# Patient Record
Sex: Female | Born: 1935 | Race: Black or African American | Hispanic: No | Smoking: Current some day smoker
Health system: Southern US, Community
[De-identification: ages and names within clinical notes are randomized; demographics above are authoritative.]

## PROBLEM LIST (undated history)

## (undated) DIAGNOSIS — J40 Bronchitis, not specified as acute or chronic: Secondary | ICD-10-CM

## (undated) DIAGNOSIS — J449 Chronic obstructive pulmonary disease, unspecified: Secondary | ICD-10-CM

## (undated) DIAGNOSIS — I10 Essential (primary) hypertension: Secondary | ICD-10-CM

## (undated) HISTORY — DX: Essential (primary) hypertension: I10

## (undated) HISTORY — PX: VESICOVAGINAL FISTULA CLOSURE W/ TAH: SUR271

---

## 2013-04-05 ENCOUNTER — Other Ambulatory Visit: Payer: Self-pay | Admitting: Internal Medicine

## 2013-04-05 DIAGNOSIS — J449 Chronic obstructive pulmonary disease, unspecified: Secondary | ICD-10-CM

## 2013-04-05 DIAGNOSIS — F17200 Nicotine dependence, unspecified, uncomplicated: Secondary | ICD-10-CM

## 2013-04-12 ENCOUNTER — Ambulatory Visit (HOSPITAL_COMMUNITY)
Admission: RE | Admit: 2013-04-12 | Discharge: 2013-04-12 | Disposition: A | Discharge status: Home / Self Care | Payer: Medicare Other | Source: Ambulatory Visit | Attending: Internal Medicine | Admitting: Internal Medicine

## 2013-04-12 ENCOUNTER — Other Ambulatory Visit: Payer: Self-pay | Admitting: Internal Medicine

## 2013-04-12 DIAGNOSIS — J449 Chronic obstructive pulmonary disease, unspecified: Secondary | ICD-10-CM

## 2013-04-12 DIAGNOSIS — F17200 Nicotine dependence, unspecified, uncomplicated: Secondary | ICD-10-CM

## 2013-05-05 ENCOUNTER — Encounter: Payer: Self-pay | Admitting: Internal Medicine

## 2013-05-05 ENCOUNTER — Ambulatory Visit (INDEPENDENT_AMBULATORY_CARE_PROVIDER_SITE_OTHER): Payer: Medicare Other | Admitting: Internal Medicine

## 2013-05-05 ENCOUNTER — Ambulatory Visit (INDEPENDENT_AMBULATORY_CARE_PROVIDER_SITE_OTHER)
Admission: RE | Admit: 2013-05-05 | Discharge: 2013-05-05 | Disposition: A | Discharge status: Home / Self Care | Payer: Medicare Other | Source: Ambulatory Visit | Attending: Internal Medicine | Admitting: Internal Medicine

## 2013-05-05 VITALS — BP 120/86 | HR 95 | Temp 100.3°F | Ht 66.0 in | Wt 135.0 lb

## 2013-05-05 DIAGNOSIS — J449 Chronic obstructive pulmonary disease, unspecified: Secondary | ICD-10-CM

## 2013-05-05 DIAGNOSIS — F17200 Nicotine dependence, unspecified, uncomplicated: Secondary | ICD-10-CM

## 2013-05-05 DIAGNOSIS — J441 Chronic obstructive pulmonary disease with (acute) exacerbation: Secondary | ICD-10-CM

## 2013-05-05 MED ORDER — BUDESONIDE-FORMOTEROL FUMARATE 160-4.5 MCG/ACT IN AERO: INHALATION_SPRAY | RESPIRATORY_TRACT | Status: DC

## 2013-05-05 NOTE — Patient Instructions (Addendum)
symbicort 160 Take 2 puffs first thing in am and then another 2 puffs about 12 hours later.   Supplement as needed for breathing with proaire 2 puffs every 4 hours  Work on inhaler technique:  relax and gently blow all the way out then take a nice smooth deep breath back in, triggering the inhaler at same time you start breathing in.  Hold for up to 5 seconds if you can.  Rinse and gargle with water when done     Please remember to go to the  x-ray department downstairs for your tests - we will call you with the results when they are available.  The key is to stop smoking completely before smoking completely stops you!   Please schedule a follow up office visit in 6 weeks, call sooner if needed with pfts

## 2013-05-05 NOTE — Progress Notes (Signed)
  Subjective:    Patient ID: Rita Shepherd, female    DOB: 09-Aug-1935 MRN: [id number]  HPI  21 yobf active smoker with dx of copd by [facility] Sept 2013 maintained on combivent and proaire and referred by Dr Margaretmary Bayley 05/05/13 to pulmonary clinic.   05/05/2013 1st pulmonary eval/ Carey Lafon  With indolent progressive doe since Sept 2013 assoc with mostly rattling but mostly non prod cough/ hoarseness while on prednisone at 20 mg qod as a floor. Presently ok walking flat and slow but sob with inclines and walking fast more than 100 ft or so. No problem at rest.  Some Better p combivent or proaire  No obvious daytime variabilty or assoc chronic cough or cp or chest tightness, subjective wheeze overt sinus or hb symptoms. No unusual exp hx or h/o childhood pna/ asthma or knowledge of premature birth.   Sleeping ok without nocturnal  or early am exacerbation  of respiratory  c/o's or need for noct saba. Also denies any obvious fluctuation of symptoms with weather or environmental changes or other aggravating or alleviating factors except as outlined above   Review of Systems  Constitutional: Negative for fever, chills and unexpected weight change.  HENT: Positive for congestion and dental problem. Negative for ear pain, nosebleeds, sore throat, rhinorrhea, sneezing, trouble swallowing, voice change, postnasal drip and sinus pressure.   Eyes: Negative for visual disturbance.  Respiratory: Positive for cough and shortness of breath. Negative for choking.   Cardiovascular: Negative for chest pain and leg swelling.  Gastrointestinal: Negative for vomiting, abdominal pain and diarrhea.  Genitourinary: Negative for difficulty urinating.  Musculoskeletal: Negative for arthralgias.  Skin: Negative for rash.  Neurological: Positive for headaches. Negative for tremors and syncope.  Hematological: Does not bruise/bleed easily.       Objective:   Physical Exam  Thin amb bf with congested  cough Wt Readings from Last 3 Encounters:  05/05/13 135 lb (61.236 kg)    HEENT mild turbinate edema.  Oropharynx no thrush or excess pnd or cobblestoning.  No JVD or cervical adenopathy. Mild accessory muscle hypertrophy. Trachea midline, nl thryroid. Chest was hyperinflated by percussion with diminished breath sounds and moderate increased exp time without wheeze. Hoover sign positive at mid inspiration. Regular rate and rhythm without murmur gallop or rub or increase P2 or edema.  Abd: no hsm, nl excursion. Ext warm without cyanosis or clubbing.    CXR  05/05/2013 :    No acute cardiopulmonary disease     Assessment & Plan:

## 2013-05-08 DIAGNOSIS — J449 Chronic obstructive pulmonary disease, unspecified: Secondary | ICD-10-CM | POA: Insufficient documentation

## 2013-05-08 DIAGNOSIS — F17210 Nicotine dependence, cigarettes, uncomplicated: Secondary | ICD-10-CM | POA: Insufficient documentation

## 2013-05-08 NOTE — Assessment & Plan Note (Signed)

## 2013-05-08 NOTE — Assessment & Plan Note (Signed)
DDX of  difficult airways managment all start with A and  include Adherence, Ace Inhibitors, Acid Reflux, Active Sinus Disease, Alpha 1 Antitripsin deficiency, Anxiety masquerading as Airways dz,  ABPA,  allergy(esp in young), Aspiration (esp in elderly), Adverse effects of DPI,  Active smokers, plus two Bs  = Bronchiectasis and Beta blocker use..and one C= CHF  Adherence is always the initial "prime suspect" and is a multilayered concern that requires a "trust but verify" approach in every patient - starting with knowing how to use medications, especially inhalers, correctly, keeping up with refills and understanding the fundamental difference between maintenance and prns vs those medications only taken for a very short course and then stopped and not refilled. The proper method of use, as well as anticipated side effects, of a metered-dose inhaler are discussed and demonstrated to the patient. Improved effectiveness after extensive coaching during this visit to a level of approximately  75% so start symbicort 160 2bid  Active smoking > discussed separately  See instructions for specific recommendations which were reviewed directly with the patient who was given a copy with highlighter outlining the key components.

## 2013-05-09 NOTE — Progress Notes (Signed)
Quick Note:  Spoke with pt and notified of results per Dr. Wert. Pt verbalized understanding and denied any questions.  ______ 

## 2013-06-16 ENCOUNTER — Encounter: Payer: Self-pay | Admitting: Internal Medicine

## 2013-06-16 ENCOUNTER — Ambulatory Visit (INDEPENDENT_AMBULATORY_CARE_PROVIDER_SITE_OTHER): Payer: Medicare Other | Admitting: Internal Medicine

## 2013-06-16 VITALS — BP 160/100 | HR 44 | Temp 98.4°F | Ht 66.0 in | Wt 137.0 lb

## 2013-06-16 DIAGNOSIS — I10 Essential (primary) hypertension: Secondary | ICD-10-CM

## 2013-06-16 DIAGNOSIS — J449 Chronic obstructive pulmonary disease, unspecified: Secondary | ICD-10-CM

## 2013-06-16 DIAGNOSIS — F17200 Nicotine dependence, unspecified, uncomplicated: Secondary | ICD-10-CM

## 2013-06-16 LAB — PULMONARY FUNCTION TEST
DL/VA % pred: 57 %
FEF 25-75 Post: 1.12 L/sec
FEF2575-%Pred-Post: 69 %
FEV1-%Change-Post: 24 %
FEV1-%Pred-Pre: 67 %
FEV1-Post: 1.57 L
FEV1-Pre: 1.26 L
FEV1FVC-%Change-Post: -1 %
FEV1FVC-%Pred-Pre: 76 %
FVC-%Pred-Pre: 90 %
FVC-Post: 2.76 L
Post FEV1/FVC ratio: 57 %
Pre FEV1/FVC ratio: 58 %
Pre FEV6/FVC Ratio: 95 %
RV % pred: 139 %
RV: 3.34 L

## 2013-06-16 NOTE — Progress Notes (Signed)
  Subjective:    Patient ID: Rita Shepherd, female    DOB: 10/20/1935 MRN: [id number]   Brief patient profile:  29 yobf active smoker with dx of copd by [facility] Sept 2013 maintained on combivent and proaire and referred by Dr Margaretmary Bayley 05/05/13 to pulmonary clinic.   05/05/2013 1st pulmonary eval/ Rachael Ferrie  With indolent progressive doe since Sept 2013 assoc with mostly rattling but mostly non prod cough/ hoarseness while on prednisone at 20 mg qod as a floor. Presently ok walking flat and slow but sob with inclines and walking fast more than 100 ft or so. No problem at rest.  Some Better p combivent or proaire rec symbicort 160 Take 2 puffs first thing in am and then another 2 puffs about 12 hours later.  Supplement as needed for breathing with proaire 2 puffs every 4 hours Work on inhaler technique:    The key is to stop smoking completely before smoking completely stops you!   06/16/2013 f/u ov/Joeseph Verville re GOLD I copd Chief Complaint  Patient presents with  . Followup with PFT    Pt reports breathing is much improved since her last visit. No new co's today.   rare need for saba / min smoking   No obvious daytime variabilty or assoc purulent sputum production cp or chest tightness, subjective wheeze overt sinus or hb symptoms. No unusual exp hx or h/o childhood pna/ asthma or knowledge of premature birth.   Sleeping ok without nocturnal  or early am exacerbation  of respiratory  c/o's or need for noct saba. Also denies any obvious fluctuation of symptoms with weather or environmental changes or other aggravating or alleviating factors except as outlined above.  Current Medications, Allergies, Past Medical History, Past Surgical History, Family History, and Social History were reviewed in [employer] record.  ROS  The following are not active complaints unless bolded sore throat, dysphagia, dental problems, itching, sneezing,  nasal congestion or excess/  purulent secretions, ear ache,   fever, chills, sweats, unintended wt loss, pleuritic or exertional cp, hemoptysis,  orthopnea pnd or leg swelling, presyncope, palpitations, heartburn, abdominal pain, anorexia, nausea, vomiting, diarrhea  or change in bowel or urinary habits, change in stools or urine, dysuria,hematuria,  rash, arthralgias, visual complaints, headache, numbness weakness or ataxia or problems with walking or coordination,  change in mood/affect or memory.            Objective:   Physical Exam  Thin amb bf with mildly  Congested sounding cough  Wt Readings from Last 3 Encounters:  06/16/13 137 lb (62.143 kg)  05/05/13 135 lb (61.236 kg)      HEENT mild turbinate edema.  Oropharynx no thrush or excess pnd or cobblestoning.  No JVD or cervical adenopathy. Mild accessory muscle hypertrophy. Trachea midline, nl thryroid. Chest was hyperinflated by percussion with diminished breath sounds and moderate increased exp time without wheeze. Hoover sign positive at mid inspiration. Regular rate and rhythm without murmur gallop or rub or increase P2 or edema.  Abd: no hsm, nl excursion. Ext warm without cyanosis or clubbing.    CXR  05/05/2013 :    No acute cardiopulmonary disease     Assessment & Plan:

## 2013-06-16 NOTE — Progress Notes (Signed)
PFT done today. 

## 2013-06-16 NOTE — Patient Instructions (Addendum)
Continue symbicort 160 Take 2 puffs first thing in am and then another 2 puffs about 12 hours later.   Only use your albuterol as a rescue medication to be used if you can't catch your breath by resting or doing a relaxed purse lip breathing pattern. The less you use it, the better it will work when you need it.    If you are satisfied with your treatment plan let your doctor know and he/she can either refill your medications or you can return here when your prescription runs out.     If in any way you are not 100% satisfied,  please tell us.  If 100% better, tell your friends!

## 2013-06-17 DIAGNOSIS — I10 Essential (primary) hypertension: Secondary | ICD-10-CM | POA: Insufficient documentation

## 2013-06-17 NOTE — Assessment & Plan Note (Signed)
>  3 min I reviewed the Flethcher curve with patient that basically indicates  if you quit smoking when your best day FEV1 is still well preserved (as hers clearly is)  it is highly unlikely you will progress to severe disease and informed the patient there was no medication on the market that has proven to change the curve or the likelihood of progression.  Therefore stopping smoking and maintaining abstinence is the most important aspect of care, not choice of inhalers or for that matter, doctors.

## 2013-06-17 NOTE — Assessment & Plan Note (Addendum)
Transiently elevated after pft "exertion" > no change rx rec but did rec f/u with her primary doctor

## 2013-06-17 NOTE — Assessment & Plan Note (Signed)
-   PFT's 06/16/13  FEV1 1.26 (67%) ratio 68 but FEV1  84% p B2,  raio 58 and DLCO 45 corrrects 57%   She barely has copd at all and perhaps is better characterized as CB with AB component doing much better despite still smoking some and suboptimal hfa  The proper method of use, as well as anticipated side effects, of a metered-dose inhaler are discussed and demonstrated to the patient. Improved effectiveness after extensive coaching during this visit to a level of approximately  75%  rec continue symbicort and prn saba, work on complete smoking cessation , and f/u fprn  See instructions for specific recommendations which were reviewed directly with the patient who was given a copy with highlighter outlining the key components.

## 2013-09-11 ENCOUNTER — Encounter: Payer: Self-pay | Admitting: Critical Care Medicine

## 2013-09-11 ENCOUNTER — Ambulatory Visit (INDEPENDENT_AMBULATORY_CARE_PROVIDER_SITE_OTHER): Payer: Medicare Other | Admitting: Critical Care Medicine

## 2013-09-11 VITALS — BP 116/88 | HR 80 | Temp 98.3°F | Ht 66.0 in | Wt 131.0 lb

## 2013-09-11 DIAGNOSIS — J449 Chronic obstructive pulmonary disease, unspecified: Secondary | ICD-10-CM

## 2013-09-11 MED ORDER — PREDNISONE 10 MG PO TABS: ORAL_TABLET | ORAL | Status: DC

## 2013-09-11 MED ORDER — AZITHROMYCIN 250 MG PO TABS: 250.0000 mg | ORAL_TABLET | Freq: Every day | ORAL | Status: DC

## 2013-09-11 NOTE — Patient Instructions (Signed)
Azithromycin 250mg  Take two once then one daily until gone Prednisone 10mg  Take 4 for three days 3 for three days 2 for three days 1 for three days and stop Stay on symbicort Consider Nicorette minis 4mg  one 3-4 times daily, dissolve in the mouth Return to see Dr wert for follow up in 6 weeks

## 2013-09-11 NOTE — Progress Notes (Signed)
Subjective:    Patient ID: Rita Shepherd, female    DOB: 11-Aug-1935, 78 y.o.   MRN: [id number]  HPI  Subjective:    Patient ID: Rita Shepherd, female    DOB: Feb 26, 1935 MRN: [id number]   Brief patient profile:  65 yobf active smoker with dx of copd by [facility] Sept 2013 maintained on combivent and proaire and referred by Dr Margaretmary Bayley 05/05/13 to pulmonary clinic.   05/05/2013 1st pulmonary eval/ Wert  With indolent progressive doe since Sept 2013 assoc with mostly rattling but mostly non prod cough/ hoarseness while on prednisone at 20 mg qod as a floor. Presently ok walking flat and slow but sob with inclines and walking fast more than 100 ft or so. No problem at rest.  Some Better p combivent or proaire rec symbicort 160 Take 2 puffs first thing in am and then another 2 puffs about 12 hours later.  Supplement as needed for breathing with proaire 2 puffs every 4 hours Work on inhaler technique:    The key is to stop smoking completely before smoking completely stops you!   06/2013 f/u ov/Wert re GOLD I cop rare need for saba / min smoking   No obvious daytime variabilty or assoc purulent sputum production cp or chest tightness, subjective wheeze overt sinus or hb symptoms. No unusual exp hx or h/o childhood pna/ asthma or knowledge of premature birth.   Sleeping ok without nocturnal  or early am exacerbation  of respiratory  c/o's or need for noct saba. Also denies any obvious fluctuation of symptoms with weather or environmental changes or other aggravating or alleviating factors except as outlined above.  09/11/2013 Chief Complaint  Patient presents with  . Acute Visit    [facility] pt.  C/o increased SOB x 2-3 days, wheezing, chest tightness, and prod cough with yellow mucus.    Pt started on symbicort and did well for two months but past week is worse. Not lasting, coughing more yellow, notes some wheezing.  No real chest pain, feels congested.  More dyspnea is noted,  some qhs cough.   Worse if walk to bathroom. Proair is used more  Pt still smoking some.  On nicorette gum  Current Medications, Allergies, Past Medical History, Past Surgical History, Family History, and Social History were reviewed in [employer] record.  ROS  The following are not active complaints unless bolded sore throat, dysphagia, dental problems, itching, sneezing,  nasal congestion or excess/ purulent secretions, ear ache,   fever, chills, sweats, unintended wt loss, pleuritic or exertional cp, hemoptysis,  orthopnea pnd or leg swelling, presyncope, palpitations, heartburn, abdominal pain, anorexia, nausea, vomiting, diarrhea  or change in bowel or urinary habits, change in stools or urine, dysuria,hematuria,  rash, arthralgias, visual complaints, headache, numbness weakness or ataxia or problems with walking or coordination,  change in mood/affect or memory.            Objective:   Physical Exam  Thin amb bf with mildly  Congested sounding cough  Wt Readings from Last 3 Encounters:  09/11/13 131 lb (59.421 kg)  06/16/13 137 lb (62.143 kg)  05/05/13 135 lb (61.236 kg)      Filed Vitals:   09/11/13 1629  BP: 116/88  Pulse: 80  Temp: 98.3 F (36.8 C)  TempSrc: Oral  Height: 5\' 6"  (1.676 m)  Weight: 131 lb (59.421 kg)  SpO2: 95%    Gen: Pleasant, well-nourished, in no distress,  normal affect  ENT: No  lesions,  mouth clear,  oropharynx clear, no postnasal drip  Neck: No JVD, no TMG, no carotid bruits  Lungs: No use of accessory muscles, no dullness to percussion, expired wheezes scattered rhonchi  Cardiovascular: RRR, heart sounds normal, no murmur or gallops, no peripheral edema  Abdomen: soft and NT, no HSM,  BS normal  Musculoskeletal: No deformities, no cyanosis or clubbing  Neuro: alert, non focal  Skin: Warm, no lesions or rashes  No results found.  CXR  05/05/2013 :    No acute cardiopulmonary disease     Assessment &  Plan:   COPD GOLD 1  Gold stage A COPD with acute exacerbation The patient continues to actively smoke Plan Azithromycin 250mg  Take two once then one daily until gone Prednisone 10mg  Take 4 for three days 3 for three days 2 for three days 1 for three days and stop Stay on symbicort Consider Nicorette minis 4mg  one 3-4 times daily, dissolve in the mouth Return to see Dr wert for follow up in 6 weeks    Updated Medication List Outpatient Encounter Prescriptions as of 09/11/2013  Medication Sig Dispense Refill  . albuterol (PROAIR HFA) 108 (90 BASE) MCG/ACT inhaler Inhale 2 puffs into the lungs every 6 (six) hours as needed for wheezing or shortness of breath.      Marland Kitchen amLODipine (NORVASC) 5 MG tablet Take 5 mg by mouth daily.      Marland Kitchen aspirin 81 MG tablet Take 81 mg by mouth daily.      . bisacodyl (DULCOLAX) 10 MG suppository Place 10 mg rectally as needed for constipation.      . budesonide-formoterol (SYMBICORT) 160-4.5 MCG/ACT inhaler Take 2 puffs first thing in am and then another 2 puffs about 12 hours later.  1 Inhaler  11  . Cholecalciferol (VITAMIN D) 2000 UNITS CAPS Take 1 capsule by mouth daily.      Marland Kitchen dextromethorphan-guaiFENesin (MUCINEX DM) 30-600 MG per 12 hr tablet Take 1 tablet by mouth every 12 (twelve) hours as needed.      . docusate sodium (COLACE) 100 MG capsule Take 100 mg by mouth at bedtime.      . Homeopathic Products ([employer] LEG CRAMPS PAIN RELIEF PO) Take 1 tablet by mouth daily as needed.      . Hypertonic Nasal Wash (SINUS RINSE NA) Place into the nose 2 (two) times daily as needed.      Marland Kitchen losartan-hydrochlorothiazide (HYZAAR) 100-12.5 MG per tablet Take 1 tablet by mouth daily.      . nicotine polacrilex (NICORETTE) 2 MG gum Take 2 mg by mouth as needed for smoking cessation.      . Potassium Gluconate 595 MG CAPS Take 1 capsule by mouth daily.      . Probiotic Product (HEALTHY COLON) CAPS Take 1 capsule by mouth daily.      . vitamin E 400 UNIT capsule Take 400  Units by mouth daily.      Marland Kitchen azithromycin (ZITHROMAX) 250 MG tablet Take 1 tablet (250 mg total) by mouth daily. Take two once then one daily until gone  6 each  0  . predniSONE (DELTASONE) 10 MG tablet Take 4 for three days 3 for three days 2 for three days 1 for three days and stop  30 tablet  0  . [DISCONTINUED] atenolol (TENORMIN) 25 MG tablet Take 25 mg by mouth daily.      . [DISCONTINUED] Phenylephrine-Guaifenesin (MUCUS RELIEF D PO) Per bottle directions as needed      . [  DISCONTINUED] predniSONE (DELTASONE) 20 MG tablet Take 20 mg by mouth every other day.       No facility-administered encounter medications on file as of 09/11/2013.        Review of Systems     Objective:   Physical Exam        Assessment & Plan:

## 2013-09-12 ENCOUNTER — Encounter: Payer: Self-pay | Admitting: Critical Care Medicine

## 2013-09-12 NOTE — Assessment & Plan Note (Addendum)
Gold stage A COPD with acute exacerbation The patient continues to actively smoke Plan Azithromycin 250mg  Take two once then one daily until gone Prednisone 10mg  Take 4 for three days 3 for three days 2 for three days 1 for three days and stop Stay on symbicort Consider Nicorette minis 4mg  one 3-4 times daily, dissolve in the mouth Return to see Dr wert for follow up in 6 weeks

## 2014-01-22 ENCOUNTER — Other Ambulatory Visit: Payer: Self-pay | Admitting: Internal Medicine

## 2014-01-22 MED ORDER — BUDESONIDE-FORMOTEROL FUMARATE 160-4.5 MCG/ACT IN AERO: INHALATION_SPRAY | RESPIRATORY_TRACT | Status: DC

## 2014-06-18 ENCOUNTER — Encounter: Payer: Self-pay | Admitting: Internal Medicine

## 2014-12-28 ENCOUNTER — Telehealth: Payer: Self-pay | Admitting: Internal Medicine

## 2014-12-28 NOTE — Telephone Encounter (Signed)
Spoke with patient-states [place] fax[place] forms to MW to fill out for her to continue getting assistance with meds. Pt is aware I will [facility] and [facility] to advise. Thanks.

## 2015-01-03 NOTE — Telephone Encounter (Signed)
Did we ever receive these forms? Thanks!

## 2015-01-03 NOTE — Telephone Encounter (Signed)
Still have not seen the forms  LMTCB for the pt

## 2015-01-04 NOTE — Telephone Encounter (Signed)
Called and spoke to pt. Informed pt we have not yet received her forms for pt assistance. Pt stated she will have them re-fax them to triage fax. Will await forms.

## 2015-01-04 NOTE — Telephone Encounter (Signed)
Done  Forms signed and faxed to [place] and placed in MW scan folder I spoke with the pt and notified that this was done  Nothing further needed

## 2015-01-04 NOTE — Telephone Encounter (Signed)
Spoke with pt and she is aware of forms that have been received at the triage fax.  i have placed these forms in MW look at to be signed.  Will forward to SachseLeslie to follow up on forms and fax these back asap so the shipment can be sent out today.  thanks

## 2015-04-05 ENCOUNTER — Emergency Department (HOSPITAL_BASED_OUTPATIENT_CLINIC_OR_DEPARTMENT_OTHER)
Admission: EM | Admit: 2015-04-05 | Discharge: 2015-04-05 | Disposition: A | Discharge status: Home / Self Care | Payer: Commercial Managed Care - HMO | Attending: Emergency Medicine | Admitting: Emergency Medicine

## 2015-04-05 ENCOUNTER — Encounter (HOSPITAL_BASED_OUTPATIENT_CLINIC_OR_DEPARTMENT_OTHER): Payer: Self-pay | Admitting: *Deleted

## 2015-04-05 ENCOUNTER — Telehealth (HOSPITAL_BASED_OUTPATIENT_CLINIC_OR_DEPARTMENT_OTHER): Payer: Self-pay | Admitting: Emergency Medicine

## 2015-04-05 DIAGNOSIS — L509 Urticaria, unspecified: Secondary | ICD-10-CM | POA: Diagnosis not present

## 2015-04-05 DIAGNOSIS — Z79899 Other long term (current) drug therapy: Secondary | ICD-10-CM | POA: Diagnosis not present

## 2015-04-05 DIAGNOSIS — Z720 Tobacco use: Secondary | ICD-10-CM | POA: Insufficient documentation

## 2015-04-05 DIAGNOSIS — I10 Essential (primary) hypertension: Secondary | ICD-10-CM | POA: Insufficient documentation

## 2015-04-05 DIAGNOSIS — R21 Rash and other nonspecific skin eruption: Secondary | ICD-10-CM | POA: Diagnosis present

## 2015-04-05 LAB — CBC WITH DIFFERENTIAL/PLATELET
BASOS ABS: 0 10*3/uL (ref 0.0–0.1)
BASOS PCT: 0 % (ref 0–1)
EOS ABS: 0.1 10*3/uL (ref 0.0–0.7)
EOS PCT: 1 % (ref 0–5)
HEMATOCRIT: 39.8 % (ref 36.0–46.0)
Hemoglobin: 13.1 g/dL (ref 12.0–15.0)
Lymphocytes Relative: 35 % (ref 12–46)
Lymphs Abs: 2.7 10*3/uL (ref 0.7–4.0)
MCH: 31.4 pg (ref 26.0–34.0)
MCHC: 32.9 g/dL (ref 30.0–36.0)
MCV: 95.4 fL (ref 78.0–100.0)
MONO ABS: 0.4 10*3/uL (ref 0.1–1.0)
Monocytes Relative: 5 % (ref 3–12)
Neutro Abs: 4.6 10*3/uL (ref 1.7–7.7)
Neutrophils Relative %: 59 % (ref 43–77)
Platelets: 204 10*3/uL (ref 150–400)
RBC: 4.17 MIL/uL (ref 3.87–5.11)
RDW: 13.1 % (ref 11.5–15.5)
WBC: 7.8 10*3/uL (ref 4.0–10.5)

## 2015-04-05 LAB — BASIC METABOLIC PANEL
ANION GAP: 11 (ref 5–15)
BUN: 22 mg/dL — ABNORMAL HIGH (ref 6–20)
CALCIUM: 9.9 mg/dL (ref 8.9–10.3)
CO2: 27 mmol/L (ref 22–32)
Chloride: 104 mmol/L (ref 101–111)
Creatinine, Ser: 0.86 mg/dL (ref 0.44–1.00)
GFR calc Af Amer: >60 mL/min (ref >60)
Glucose, Bld: 105 mg/dL — ABNORMAL HIGH (ref 65–99)
POTASSIUM: 3.6 mmol/L (ref 3.5–5.1)
Sodium: 142 mmol/L (ref 135–145)

## 2015-04-05 NOTE — Discharge Instructions (Signed)
You can take allegra, zyrtec, or claritin during the day, and benadryl at night.   Hives Hives are itchy, red, swollen areas of the skin. They can vary in size and location on your body. Hives can come and go for hours or several days (acute hives) or for several weeks (chronic hives). Hives do not spread from person to person (noncontagious). They may get worse with scratching, exercise, and emotional stress. CAUSES   Allergic reaction to food, additives, or drugs.  Infections, including the common cold.  Illness, such as vasculitis, lupus, or thyroid disease.  Exposure to sunlight, heat, or cold.  Exercise.  Stress.  Contact with chemicals. SYMPTOMS   Red or white swollen patches on the skin. The patches may change size, shape, and location quickly and repeatedly.  Itching.  Swelling of the hands, feet, and face. This may occur if hives develop deeper in the skin. DIAGNOSIS  Your caregiver can usually tell what is wrong by performing a physical exam. Skin or blood tests may also be done to determine the cause of your hives. In some cases, the cause cannot be determined. TREATMENT  Mild cases usually get better with medicines such as antihistamines. Severe cases may require an emergency epinephrine injection. If the cause of your hives is known, treatment includes avoiding that trigger.  HOME CARE INSTRUCTIONS   Avoid causes that trigger your hives.  Take antihistamines as directed by your caregiver to reduce the severity of your hives. Non-sedating or low-sedating antihistamines are usually recommended. Do not drive while taking an antihistamine.  Take any other medicines prescribed for itching as directed by your caregiver.  Wear loose-fitting clothing.  Keep all follow-up appointments as directed by your caregiver. SEEK MEDICAL CARE IF:   You have persistent or severe itching that is not relieved with medicine.  You have painful or swollen joints. SEEK IMMEDIATE  MEDICAL CARE IF:   You have a fever.  Your tongue or lips are swollen.  You have trouble breathing or swallowing.  You feel tightness in the throat or chest.  You have abdominal pain. These problems may be the first sign of a life-threatening allergic reaction. Call your local emergency services (911 in [place] MAKE SURE YOU:   Understand these instructions.  Will watch your condition.  Will get help right away if you are not doing well or get worse. Document Released: 11/02/2005 Document Revised: 11/07/2013 Document Reviewed: 01/26/2012 [facility] Patient Information 2015 [employer] [place]. This information is not intended to replace advice given to you by your health care provider. Make sure you discuss any questions you have with your health care provider.

## 2015-04-05 NOTE — ED Provider Notes (Signed)
CSN: [id number]     Arrival date & time 04/05/15  0705 History   First MD Initiated Contact with Patient 04/05/15 [phone]     Chief Complaint  Patient presents with  . Rash     (Consider location/radiation/quality/duration/timing/severity/associated sxs/prior Treatment) Patient is a 80 y.o. female presenting with rash.  Rash Location:  Full body Quality: itchiness and redness   Severity:  Mild Onset quality:  Gradual Duration:  1 day Timing:  Intermittent Progression:  Waxing and waning Chronicity:  New Context comment:  Ate [employer] last night (not new), no new medications or exposure Relieved by:  Nothing Worsened by:  Nothing tried Associated symptoms: no abdominal pain, no diarrhea, no fever, no nausea, no shortness of breath, no throat swelling, no tongue swelling, not vomiting and not wheezing     Past Medical History  Diagnosis Date  . Hypertension    Past Surgical History  Procedure Laterality Date  . Vesicovaginal fistula closure w/ tah     Family History  Problem Relation Age of Onset  . Prostate cancer Father   . Prostate cancer Brother    History  Substance Use Topics  . Smoking status: Current Some Day Smoker -- 50 years    Types: Cigarettes  . Smokeless tobacco: Never Used     Comment: smokes "a couple of puffs every day or so" and using nicorette gum  . Alcohol Use: No   OB History    No data available     Review of Systems  Constitutional: Negative for fever.  Respiratory: Negative for shortness of breath and wheezing.   Gastrointestinal: Negative for nausea, vomiting, abdominal pain and diarrhea.  Skin: Positive for rash.  All other systems reviewed and are negative.     Allergies  Contrast media  Home Medications   Prior to Admission medications   Medication Sig Start Date End Date Taking? Authorizing Provider  albuterol (PROAIR HFA) 108 (90 BASE) MCG/ACT inhaler Inhale 2 puffs into the lungs every 6 (six) hours as needed for  wheezing or shortness of breath.    Historical Provider, MD  amLODipine (NORVASC) 5 MG tablet Take 5 mg by mouth daily.    Historical Provider, MD  budesonide-formoterol (SYMBICORT) 160-4.5 MCG/ACT inhaler Take 2 puffs first thing in am and then another 2 puffs about 12 hours later. 01/22/14   Nyoka CowdenMichael B Wert, MD  Cholecalciferol (VITAMIN D) 2000 UNITS CAPS Take 1 capsule by mouth daily.    Historical Provider, MD  dextromethorphan-guaiFENesin (MUCINEX DM) 30-600 MG per 12 hr tablet Take 1 tablet by mouth every 12 (twelve) hours as needed.    Historical Provider, MD  docusate sodium (COLACE) 100 MG capsule Take 100 mg by mouth at bedtime.    Historical Provider, MD  Homeopathic Products ([employer] LEG CRAMPS PAIN RELIEF PO) Take 1 tablet by mouth daily as needed.    Historical Provider, MD  Hypertonic Nasal Wash (SINUS RINSE NA) Place into the nose 2 (two) times daily as needed.    Historical Provider, MD  losartan-hydrochlorothiazide (HYZAAR) 100-12.5 MG per tablet Take 1 tablet by mouth daily.    Historical Provider, MD  nicotine polacrilex (NICORETTE) 2 MG gum Take 2 mg by mouth as needed for smoking cessation.    Historical Provider, MD  Potassium Gluconate 595 MG CAPS Take 1 capsule by mouth daily.    Historical Provider, MD  Probiotic Product (HEALTHY COLON) CAPS Take 1 capsule by mouth daily.    Historical Provider, MD  vitamin E  400 UNIT capsule Take 400 Units by mouth daily.    Historical Provider, MD   BP 150/86 mmHg  Pulse 75  Temp(Src) 97.5 F (36.4 C) (Oral)  Resp 18  Ht 5\' 6"  (1.676 m)  Wt 140 lb (63.504 kg)  BMI 22.61 kg/m2  SpO2 100% Physical Exam  Constitutional: She is oriented to person, place, and time. She appears well-developed and well-nourished.  HENT:  Head: Normocephalic and atraumatic.  Right Ear: External ear normal.  Left Ear: External ear normal.  Eyes: Conjunctivae and EOM are normal. Pupils are equal, round, and reactive to light.  Neck: Normal range of motion.  Neck supple.  Cardiovascular: Normal rate, regular rhythm, normal heart sounds and intact distal pulses.   Pulmonary/Chest: Effort normal and breath sounds normal.  Abdominal: Soft. Bowel sounds are normal. There is no tenderness.  Musculoskeletal: Normal range of motion.  Neurological: She is alert and oriented to person, place, and time.  Skin: Skin is warm and dry.  Urticaria over L hip  Vitals reviewed.   ED Course  Procedures (including critical care time) Labs Review Labs Reviewed  BASIC METABOLIC PANEL - Abnormal; Notable for the following:    Glucose, Bld 105 (*)    BUN 22 (*)    All other components within normal limits  CBC WITH DIFFERENTIAL/PLATELET    Imaging Review No results found.   EKG Interpretation None      MDM   Final diagnoses:  Urticarial rash    80 y.o. female with pertinent PMH of HTN presents with urticarial rash without clear precipitant.  Exam reassuring.  No new exposures or signs of anaphylaxis, and no concerning factors.   Checked cbc to ensure pt did not have thrombocytosis or other concerning etiology given age and lack of known exposure.  DC home in stable condition. I have reviewed all laboratory and imaging studies if ordered as above  1. Urticarial rash         Mirian MoMatthew Caelie [place], MD 04/05/15 [phone]

## 2015-04-05 NOTE — ED Notes (Signed)
Pt amb to room 5 with quick steady gait in nad. Pt reports sudden onset of "rash all over" after eating a hamburger at [employer] around 6pm last night. Pt states "as soon as I finished it, I started itching and burning." pt noted with redness to her back, stomach, buttocks, under her breasts and on her neck, pt states "it's all over, from the knees up".

## 2015-04-05 NOTE — ED Notes (Signed)
Pt reports that her itching and burning has resolved, "except for a little on my low back.Marland Kitchen.Marland Kitchen."

## 2015-07-25 ENCOUNTER — Other Ambulatory Visit: Payer: Self-pay | Admitting: Internal Medicine

## 2015-07-25 DIAGNOSIS — Z1231 Encounter for screening mammogram for malignant neoplasm of breast: Secondary | ICD-10-CM

## 2015-07-25 DIAGNOSIS — Z780 Asymptomatic menopausal state: Secondary | ICD-10-CM

## 2015-07-29 ENCOUNTER — Ambulatory Visit (HOSPITAL_BASED_OUTPATIENT_CLINIC_OR_DEPARTMENT_OTHER): Payer: Medicare Other

## 2015-07-30 ENCOUNTER — Other Ambulatory Visit (HOSPITAL_BASED_OUTPATIENT_CLINIC_OR_DEPARTMENT_OTHER): Payer: Medicare Other

## 2015-07-30 ENCOUNTER — Ambulatory Visit (HOSPITAL_BASED_OUTPATIENT_CLINIC_OR_DEPARTMENT_OTHER): Payer: Medicare Other

## 2015-08-02 ENCOUNTER — Ambulatory Visit (HOSPITAL_BASED_OUTPATIENT_CLINIC_OR_DEPARTMENT_OTHER)
Admission: RE | Admit: 2015-08-02 | Discharge: 2015-08-02 | Disposition: A | Discharge status: Home / Self Care | Payer: Commercial Managed Care - HMO | Source: Ambulatory Visit | Attending: Internal Medicine | Admitting: Internal Medicine

## 2015-08-02 DIAGNOSIS — M8580 Other specified disorders of bone density and structure, unspecified site: Secondary | ICD-10-CM | POA: Diagnosis not present

## 2015-08-02 DIAGNOSIS — Z780 Asymptomatic menopausal state: Secondary | ICD-10-CM

## 2015-08-02 DIAGNOSIS — Z1231 Encounter for screening mammogram for malignant neoplasm of breast: Secondary | ICD-10-CM | POA: Diagnosis present

## 2016-01-13 ENCOUNTER — Telehealth: Payer: Self-pay | Admitting: Internal Medicine

## 2016-01-13 NOTE — Telephone Encounter (Signed)
Spoke with pt. She only wants to see MW. Fernand Parkins has been scheduled for 02/11/16 at 9:15am. Nothing further was needed.

## 2016-01-15 ENCOUNTER — Telehealth: Payer: Self-pay | Admitting: Internal Medicine

## 2016-01-15 MED ORDER — BUDESONIDE-FORMOTEROL FUMARATE 160-4.5 MCG/ACT IN AERO: INHALATION_SPRAY | RESPIRATORY_TRACT | Status: DC

## 2016-01-15 NOTE — Telephone Encounter (Signed)
Spoke with pt, requesting refill of symbicort to [facility] order pharmacy.  I advised pt that she needs to keep rov this month to continue receiving refills from our office.  This rx has been sent.  Nothing further needed.

## 2016-02-11 ENCOUNTER — Ambulatory Visit: Payer: Commercial Managed Care - HMO | Admitting: Internal Medicine

## 2016-06-29 ENCOUNTER — Other Ambulatory Visit: Payer: Self-pay | Admitting: Internal Medicine

## 2016-07-03 ENCOUNTER — Telehealth: Payer: Self-pay | Admitting: Internal Medicine

## 2016-07-03 NOTE — Telephone Encounter (Signed)
Spoke with pt, requesting refill on Symbicort.  I advised pt that she has not been seen in clinic in 3 years, and we could not prescribe this for her.  Advised pt to contact PCP for refill.  Pt expressed understanding.    Pt requesting ov with MW to re-establish care.  rov has been scheduled.  Nothing further needed.

## 2016-07-03 NOTE — Telephone Encounter (Signed)
Pt calling again about request for symbicort, please advise.[name]

## 2016-07-21 ENCOUNTER — Telehealth: Payer: Self-pay | Admitting: Internal Medicine

## 2016-07-21 MED ORDER — BUDESONIDE-FORMOTEROL FUMARATE 160-4.5 MCG/ACT IN AERO: 2.0000 | INHALATION_SPRAY | Freq: Two times a day (BID) | RESPIRATORY_TRACT | 0 refills | Status: DC

## 2016-07-21 NOTE — Telephone Encounter (Signed)
Called and spoke with pt and she is aware of sample of the symbicort 160 that has been left up front for the pt to pick up.

## 2016-07-28 ENCOUNTER — Ambulatory Visit (INDEPENDENT_AMBULATORY_CARE_PROVIDER_SITE_OTHER): Payer: Commercial Managed Care - HMO | Admitting: Internal Medicine

## 2016-07-28 ENCOUNTER — Encounter: Payer: Self-pay | Admitting: Internal Medicine

## 2016-07-28 ENCOUNTER — Ambulatory Visit (INDEPENDENT_AMBULATORY_CARE_PROVIDER_SITE_OTHER)
Admission: RE | Admit: 2016-07-28 | Discharge: 2016-07-28 | Disposition: A | Discharge status: Home / Self Care | Payer: Commercial Managed Care - HMO | Source: Ambulatory Visit | Attending: Internal Medicine | Admitting: Internal Medicine

## 2016-07-28 VITALS — BP 118/86 | HR 100 | Ht 65.5 in | Wt 136.4 lb

## 2016-07-28 DIAGNOSIS — J449 Chronic obstructive pulmonary disease, unspecified: Secondary | ICD-10-CM

## 2016-07-28 DIAGNOSIS — F17210 Nicotine dependence, cigarettes, uncomplicated: Secondary | ICD-10-CM

## 2016-07-28 MED ORDER — PREDNISONE 10 MG PO TABS: ORAL_TABLET | ORAL | 0 refills | Status: DC

## 2016-07-28 MED ORDER — AMOXICILLIN-POT CLAVULANATE 875-125 MG PO TABS: 1.0000 | ORAL_TABLET | Freq: Two times a day (BID) | ORAL | 0 refills | Status: AC

## 2016-07-28 NOTE — Progress Notes (Signed)
Subjective:    Patient ID: Rita Shepherd, female    DOB: 14-May-1935 MRN: [id number]   Brief patient profile:  62 yobf active smoker with dx of copd by [facility] Sept 2013 maintained on combivent and proaire and referred by Dr Margaretmary Bayley 05/05/13 to pulmonary clinic.   History of Present Illness  05/05/2013 1st pulmonary eval/ Wert  With indolent progressive doe since Sept 2013 assoc with mostly rattling but mostly non prod cough/ hoarseness while on prednisone at 20 mg qod as a floor. Presently ok walking flat and slow but sob with inclines and walking fast more than 100 ft or so. No problem at rest.  Some Better p combivent or proaire rec symbicort 160 Take 2 puffs first thing in am and then another 2 puffs about 12 hours later.  Supplement as needed for breathing with proaire 2 puffs every 4 hours Work on inhaler technique:    The key is to stop smoking completely before smoking completely stops you!   06/16/2013 f/u ov/Wert re GOLD I copd Chief Complaint  Patient presents with  . Followup with PFT    Pt reports breathing is much improved since her last visit. No new co's today.   rare need for saba / min smoking  rec Continue symbicort 160 Take 2 puffs first thing in am and then another 2 puffs about 12 hours later. Only use your albuterol as a rescue medication to be used if you can't catch your breath by resting or doing a relaxed purse lip breathing pattern. The less you use it, the better it will work when you need it.    07/28/2016  Extended  f/u ov/Wert re: re establish care for COPD GOLD I/ chronic rhinitis ? Sinusitis/ still smoking  Chief Complaint  Patient presents with  . Follow-up    Pt last seen Oct 2014 by Dr. Delford Field. Pt is c/o increased SOB and nasal congestion for the past 2 wks. She states she gets SOB with any exertion, even just talking.   nasal congestion assoc with purulent dischargex 2 weeks assoc with some worse sob on symb 160 2bid with  increasing need for saba Baseline doe = MMRC2 = can't walk a nl pace on a flat grade s sob but does fine slow and flat eg  shopping   No obvious daytime variabilty or assoc  cp or chest tightness, subjective wheeze overt sinus or hb symptoms. No unusual exp hx or h/o childhood pna/ asthma or knowledge of premature birth.   Sleeping ok without nocturnal  or early am exacerbation  of respiratory  c/o's or need for noct saba. Also denies any obvious fluctuation of symptoms with weather or environmental changes or other aggravating or alleviating factors except as outlined above.  Current Medications, Allergies, Past Medical History, Past Surgical History, Family History, and Social History were reviewed in [employer] record.  ROS  The following are not active complaints unless bolded sore throat, dysphagia, dental problems, itching, sneezing,  nasal congestion or excess/ purulent secretions, ear ache,   fever, chills, sweats, unintended wt loss, pleuritic or exertional cp, hemoptysis,  orthopnea pnd or leg swelling, presyncope, palpitations, heartburn, abdominal pain, anorexia, nausea, vomiting, diarrhea  or change in bowel or urinary habits, change in stools or urine, dysuria,hematuria,  rash, arthralgias, visual complaints, headache, numbness weakness or ataxia or problems with walking or coordination,  change in mood/affect or memory.            Objective:  Physical Exam  Thin amb bf with mildly  Congested sounding cough   07/28/2016       136         06/16/13 137 lb (62.143 kg)  05/05/13 135 lb (61.236 kg)     vital signs reviewed - sats 91% on RA on arrival  HEENT mild turbinate edema.  Oropharynx no thrush or excess pnd or cobblestoning.  No JVD or cervical adenopathy. Mild accessory muscle hypertrophy. Trachea midline, nl thryroid. Chest was hyperinflated by percussion with diminished breath sounds and moderate increased exp time with a few insp/ exp rhonchi  bilaterally.  Hoover sign positive at mid inspiration. Regular rate and rhythm without murmur gallop or rub or increase P2 or edema.  Abd: no hsm, nl excursion. Ext warm without cyanosis or clubbing.         CXR PA and Lateral:   07/28/2016 :    I personally reviewed images and agree with radiology impression as follows:   No active cardiopulmonary disease.       Assessment & Plan:

## 2016-07-28 NOTE — Patient Instructions (Signed)
Augmentin 875 mg take one pill twice daily  X 10 days - take at breakfast and supper with large glass of water.  It would help reduce the usual side effects (diarrhea and yeast infections) if you ate cultured yogurt at lunch.   Prednisone 10 mg take  4 each am x 2 days,   2 each am x 2 days,  1 each am x 2 days and stop   Plan A = Automatic = Symbicort 160 Take 2 puffs first thing in am and then another 2 puffs about 12 hours later.   Work on inhaler technique:  relax and gently blow all the way out then take a nice smooth deep breath back in, triggering the inhaler at same time you start breathing in.  Hold for up to 5 seconds if you can. Blow out thru nose. Rinse and gargle with water when done      Plan B = Backup Only use your albuterol as a rescue medication to be used if you can't catch your breath by resting or doing a relaxed purse lip breathing pattern.  - The less you use it, the better it will work when you need it. - Ok to use the inhaler up to 2 puffs  every 4 hours if you must but call for appointment if use goes up over your usual need - Don't leave home without it !!  (think of it like the spare tire for your car)   The key is to stop smoking completely before smoking completely stops you!   Please remember to go to the x-ray department downstairs for your tests - we will call you with the results when they are available.     Please schedule a follow up office visit in 6 weeks, call sooner if needed with pfts here - ok to push back

## 2016-07-29 ENCOUNTER — Encounter: Payer: Self-pay | Admitting: Internal Medicine

## 2016-07-29 NOTE — Assessment & Plan Note (Signed)
-   PFT's 06/16/13  FEV1 1.26 (67%) ratio 68 but FEV1  84% p B2,  raio 58 and DLCO 45 corrrects 57%  - 06/18/2014 advised of ? Adverse effect of symbicort by [place] and returned fax - notified pt if having problems needs to return if wants our input  DDX of  difficult airways management almost all start with A and  include Adherence, Ace Inhibitors, Acid Reflux, Active Sinus Disease, Alpha 1 Antitripsin deficiency, Anxiety masquerading as Airways dz,  ABPA,  Allergy(esp in young), Aspiration (esp in elderly), Adverse effects of meds,  Active smokers, A bunch of PE's (a small clot burden can't cause this syndrome unless there is already severe underlying pulm or vascular dz with poor reserve) plus two Bs  = Bronchiectasis and Beta blocker use..and one C= CHF  Adherence is always the initial "prime suspect" and is a multilayered concern that requires a "trust but verify" approach in every patient - starting with knowing how to use medications, especially inhalers, correctly, keeping up with refills and understanding the fundamental difference between maintenance and prns vs those medications only taken for a very short course and then stopped and not refilled.  - - The proper method of use, as well as anticipated side effects, of a metered-dose inhaler are discussed and demonstrated to the patient. Improved effectiveness after extensive coaching during this visit to a level of approximately 75 % from a baseline of 25 %   - admits not consistent with daily bid regimen > reinforced  Active smoking (see separate a/p)   Active / acute sinus dz > augmentin x 10 days then sinus ct next   ? Allergic/asthmatic component > Prednisone 10 mg take  4 each am x 2 days,   2 each am x 2 days,  1 each am x 2 days and stop    I had an extended discussion with the patient reviewing all relevant studies completed to date and  lasting 25 minutes of a 40  minute extended office  visit  To reestablish care with subacute deterioration  in resp status   Each maintenance medication was reviewed in detail including most importantly the difference between maintenance and prns and under what circumstances the prns are to be triggered using an action plan format that is not reflected in the computer generated alphabetically organized AVS.    Please see instructions for details which were reviewed in writing and the patient given a copy highlighting the part that I personally wrote and discussed at today's ov.

## 2016-07-29 NOTE — Assessment & Plan Note (Signed)
>   3 min Discussed the risks and costs (both direct and indirect)  of smoking relative to the benefits of quitting but patient unwilling to commit at this point to a specific quit date.    Although I don't endorse regular use of e cigs/ many pts find them helpful; however, I emphasized they should be considered a "one-way bridge" off all tobacco products.  

## 2016-07-29 NOTE — Progress Notes (Signed)
Spoke with pt and notified of results per Dr. Wert. Pt verbalized understanding and denied any questions. 

## 2016-07-31 ENCOUNTER — Telehealth: Payer: Self-pay | Admitting: Internal Medicine

## 2016-07-31 ENCOUNTER — Other Ambulatory Visit: Payer: Self-pay | Admitting: Internal Medicine

## 2016-07-31 MED ORDER — BUDESONIDE-FORMOTEROL FUMARATE 160-4.5 MCG/ACT IN AERO: 2.0000 | INHALATION_SPRAY | Freq: Two times a day (BID) | RESPIRATORY_TRACT | 3 refills | Status: DC

## 2016-07-31 NOTE — Telephone Encounter (Signed)
Spoke with pt, states she's called [employer] and they do not have her symbicort rx, and requests this be called in.  This has been sent.  Nothing further needed.

## 2016-09-08 ENCOUNTER — Ambulatory Visit: Payer: Commercial Managed Care - HMO | Admitting: Internal Medicine

## 2016-09-17 ENCOUNTER — Encounter: Payer: Self-pay | Admitting: Internal Medicine

## 2016-09-17 ENCOUNTER — Encounter (INDEPENDENT_AMBULATORY_CARE_PROVIDER_SITE_OTHER): Payer: Commercial Managed Care - HMO | Admitting: Internal Medicine

## 2016-09-17 ENCOUNTER — Ambulatory Visit (INDEPENDENT_AMBULATORY_CARE_PROVIDER_SITE_OTHER): Payer: Commercial Managed Care - HMO | Admitting: Internal Medicine

## 2016-09-17 VITALS — BP 114/76 | HR 65 | Ht 66.0 in | Wt 136.0 lb

## 2016-09-17 DIAGNOSIS — J449 Chronic obstructive pulmonary disease, unspecified: Secondary | ICD-10-CM | POA: Diagnosis not present

## 2016-09-17 DIAGNOSIS — F17210 Nicotine dependence, cigarettes, uncomplicated: Secondary | ICD-10-CM

## 2016-09-17 LAB — PULMONARY FUNCTION TEST
DL/VA % pred: 57 %
DL/VA: 2.9 ml/min/mmHg/L
DLCO UNC: 11.8 ml/min/mmHg
DLCO cor % pred: 44 %
DLCO cor: 12.07 ml/min/mmHg
DLCO unc % pred: 43 %
FEF 25-75 PRE: 0.66 L/s
FEF 25-75 Post: 1.48 L/sec
FEF2575-%Change-Post: 123 %
FEF2575-%PRED-POST: 98 %
FEF2575-%Pred-Pre: 44 %
FEV1-%CHANGE-POST: 19 %
FEV1-%PRED-POST: 101 %
FEV1-%PRED-PRE: 85 %
FEV1-PRE: 1.51 L
FEV1-Post: 1.79 L
FEV1FVC-%Change-Post: -1 %
FEV1FVC-%Pred-Pre: 84 %
FEV6-%CHANGE-POST: 21 %
FEV6-%PRED-POST: 126 %
FEV6-%PRED-PRE: 103 %
FEV6-POST: 2.77 L
FEV6-PRE: 2.28 L
FEV6FVC-%CHANGE-POST: 0 %
FEV6FVC-%PRED-POST: 102 %
FEV6FVC-%PRED-PRE: 101 %
FVC-%CHANGE-POST: 20 %
FVC-%Pred-Post: 123 %
FVC-%Pred-Pre: 101 %
FVC-Post: 2.82 L
FVC-Pre: 2.33 L
POST FEV1/FVC RATIO: 64 %
POST FEV6/FVC RATIO: 98 %
PRE FEV6/FVC RATIO: 98 %
Pre FEV1/FVC ratio: 65 %
RV % PRED: 124 %
RV: 3.07 L
TLC % pred: 105 %
TLC: 5.64 L

## 2016-09-17 NOTE — Patient Instructions (Addendum)
Plan A = Automatic = Symbicort 160 Take 2 puffs first thing in am and then another 2 puffs about 12 hours later.   Plan B = Backup Only use your albuterol (Proair) as a rescue medication to be used if you can't catch your breath by resting or doing a relaxed purse lip breathing pattern.  - The less you use it, the better it will work when you need it. - Ok to use the inhaler up to 2 puffs  every 4 hours if you must but call for appointment if use goes up over your usual need - Don't leave home without it !!  (think of it like the spare tire for your car)   The key is to stop smoking completely before smoking completely stops you!    If you are satisfied with your treatment plan,  let your doctor know and he/she can either refill your medications or you can return here when your prescription runs out.     If in any way you are not 100% satisfied,  please tell us.  If 100% better, tell your friends!  Pulmonary follow up is as needed

## 2016-09-17 NOTE — Assessment & Plan Note (Signed)

## 2016-09-17 NOTE — Progress Notes (Signed)
Subjective:    Patient ID: Rita Shepherd, female    DOB: [birth year] MRN: [id number]   Brief patient profile:  44 yobf active smoker with dx of copd by [facility] Sept 2013 maintained on combivent and proaire and referred by Dr Margaretmary Bayley 05/05/13 to pulmonary clinic.   History of Present Illness  05/05/2013 1st pulmonary eval/ [name]  With indolent progressive doe since Sept 2013 assoc with mostly rattling but mostly non prod cough/ hoarseness while on prednisone at 20 mg qod as a floor. Presently ok walking flat and slow but sob with inclines and walking fast more than 100 ft or so. No problem at rest.  Some Better p combivent or proaire rec symbicort 160 Take 2 puffs first thing in am and then another 2 puffs about 12 hours later.  Supplement as needed for breathing with proaire 2 puffs every 4 hours Work on inhaler technique:    The key is to stop smoking completely before smoking completely stops you!   06/16/2013 f/u ov/Odilon Cass re GOLD I copd Chief Complaint  Patient presents with  . Followup with PFT    Pt reports breathing is much improved since her last visit. No new co's today.   rare need for saba / min smoking  rec Continue symbicort 160 Take 2 puffs first thing in am and then another 2 puffs about 12 hours later. Only use your albuterol as a rescue medication to be used if you can't catch your breath by resting or doing a relaxed purse lip breathing pattern. The less you use it, the better it will work when you need it.    07/28/2016  Extended  f/u ov/Kassidee Narciso re: re establish care for COPD GOLD I/ chronic rhinitis ? Sinusitis/ still smoking  Chief Complaint  Patient presents with  . Follow-up    Pt last seen Oct 2014 by Dr. Delford Field. Pt is c/o increased SOB and nasal congestion for the past 2 wks. She states she gets SOB with any exertion, even just talking.   nasal congestion assoc with purulent discharg  X 2 weeks assoc with some worse sob on symb 160 2bid with  increasing need for saba Baseline doe = MMRC2 = can't walk a nl pace on a flat grade s sob but does fine slow and flat eg  shopping rec Augmentin 875 mg take one pill twice daily  X 10 days - take at breakfast and supper with large glass of water.  It would help reduce the usual side effects (diarrhea and yeast infections) if you ate cultured yogurt at lunch.  Prednisone 10 mg take  4 each am x 2 days,   2 each am x 2 days,  1 each am x 2 days and stop  Plan A = Automatic = Symbicort 160 Take 2 puffs first thing in am and then another 2 puffs about 12 hours later.  Plan B = Backup Only use your albuterol as a rescue medication The key is to stop smoking completely before smoking completely stops you!  Please schedule a follow up office visit in 6 weeks, call sooner if needed with pfts here - ok to push back     09/17/2016  f/u ov/[name] re: GOLD I copd / symb 160 2 bid very rare saba  Chief Complaint  Patient presents with  . Follow-up    PFT's done today. Breathing has improved on symbicort.    Not limited by breathing from desired activities  But very sedentary  No obvious daytime variabilty or assoc  cp or chest tightness, subjective wheeze overt sinus or hb symptoms. No unusual exp hx or h/o childhood pna/ asthma or knowledge of premature birth.   Sleeping ok without nocturnal  or early am exacerbation  of respiratory  c/o's or need for noct saba. Also denies any obvious fluctuation of symptoms with weather or environmental changes or other aggravating or alleviating factors except as outlined above.  Current Medications, Allergies, Past Medical History, Past Surgical History, Family History, and Social History were reviewed in [facility] electronic medical record.  ROS  The following are not active complaints unless bolded sore throat, dysphagia, dental problems, itching, sneezing,  nasal congestion or excess/ purulent secretions, ear ache,   fever, chills, sweats, unintended wt  loss, pleuritic or exertional cp, hemoptysis,  orthopnea pnd or leg swelling, presyncope, palpitations, heartburn, abdominal pain, anorexia, nausea, vomiting, diarrhea  or change in bowel or urinary habits, change in stools or urine, dysuria,hematuria,  rash, arthralgias, visual complaints, headache, numbness weakness or ataxia or problems with walking or coordination,  change in mood/affect or memory.            Objective:   Physical Exam  Thin amb bf  nad / all smiles   .09/17/2016       136   07/28/2016       136         06/16/13 137 lb (62.143 kg)  05/05/13 135 lb (61.236 kg)     vital signs reviewed -  - Note on arrival 02 sats  94% on RA     HEENT: nl dentition, turbinates, and oropharynx. Nl external ear canals without cough reflex   NECK :  without JVD/Nodes/TM/ nl carotid upstrokes bilaterally   LUNGS: no acc muscle use,  Nl contour chest which is clear to A and P bilaterally without cough on insp or exp maneuvers   CV:  RRR  no s3 or murmur or increase in P2, no edema   ABD:  soft and nontender with nl inspiratory excursion in the supine position. No bruits or organomegaly, bowel sounds nl  MS:  Nl gait/ ext warm without deformities, calf tenderness, cyanosis or clubbing No obvious joint restrictions   SKIN: warm and dry without lesions    NEURO:  alert, approp, nl sensorium with  no motor deficits        CXR PA and Lateral:   07/28/2016 :    I personally reviewed images and agree with radiology impression as follows:   No active cardiopulmonary disease.       Assessment & Plan:   Outpatient Encounter Prescriptions as of 09/17/2016  Medication Sig  . albuterol (PROAIR HFA) 108 (90 BASE) MCG/ACT inhaler Inhale 2 puffs into the lungs every 6 (six) hours as needed for wheezing or shortness of breath.  Marland Kitchen. amLODipine (NORVASC) 5 MG tablet Take 5 mg by mouth daily.  . budesonide-formoterol (SYMBICORT) 160-4.5 MCG/ACT inhaler Inhale 2 puffs into the lungs 2 (two)  times daily.  . Cholecalciferol (VITAMIN D) 2000 UNITS CAPS Take 1 capsule by mouth daily.  Marland Kitchen. dextromethorphan-guaiFENesin (MUCINEX DM) 30-600 MG per 12 hr tablet Take 1 tablet by mouth every 12 (twelve) hours as needed.  . docusate sodium (COLACE) 100 MG capsule Take 100 mg by mouth at bedtime.  . Homeopathic Products ([employer] LEG CRAMPS PAIN RELIEF) TABS Take 1 capsule by mouth daily as needed.  . Hypertonic Nasal Wash (SINUS RINSE NA) Place into the nose  2 (two) times daily as needed.  Marland Kitchen. losartan-hydrochlorothiazide (HYZAAR) 100-12.5 MG per tablet Take 1 tablet by mouth daily.  . Omega-3 Fatty Acids (FISH OIL PO) Take 1 capsule by mouth daily.  . Probiotic Product (HEALTHY COLON) CAPS Take 1 capsule by mouth daily.  . vitamin E 400 UNIT capsule Take 400 Units by mouth daily.  . [DISCONTINUED] predniSONE (DELTASONE) 10 MG tablet Take  4 each am x 2 days,   2 each am x 2 days,  1 each am x 2 days and stop   No facility-administered encounter medications on file as of 09/17/2016.

## 2016-09-17 NOTE — Assessment & Plan Note (Signed)
-   PFT's 06/16/13  FEV1 1.26 (67%) ratio 68 but FEV1  84% p B2,  raio 58 and DLCO 45 corrrects 57%  - 06/18/2014 advised of ? Adverse effect of symbicort by [place] and returned fax - notified pt if having problems needs to return if wants our input  - 09/17/2016  After extensive coaching HFA effectiveness =   90% - PFT's  09/17/2016  FEV1 1.79  (101 % ) ratio 64  p 19 % improvement from saba p nothing prior to study with DLCO  43/44c % corrects to 57  % for alv volume     She clearly as a very significant AB component and on rx with symbicort 160 2 bid has done well and has mastered hfa so low risk of flares/ good control of symptoms > no need to change rx and f/u can be prn    I had an extended final summary discussion with the patient reviewing all relevant studies completed to date and  lasting 15 to 20 minutes of a 25 minute visit on the following issues:     Formulary restrictions will be an ongoing challenge for the forseable future and I would be happy to pick an alternative if the pt will first  provide me a list of them but pt  will need to return here for training for any new device that is required eg dpi vs hfa vs respimat.    In meantime we can always provide samples so the patient never runs out of any needed respiratory medications.   Each maintenance medication was reviewed in detail including most importantly the difference between maintenance and as needed and under what circumstances the prns are to be used.  Please see instructions for details which were reviewed in writing and the patient given a copy.    Pulmonary f/u is prn

## 2017-05-03 ENCOUNTER — Telehealth: Payer: Self-pay | Admitting: Internal Medicine

## 2017-05-03 NOTE — Telephone Encounter (Signed)
Spoke with patient. She is waiting to try a nebulizer with meds for her COPD. Asked patient if she was currently using a neb machine and had meds, she said no. She's currently using Symbicort 160. She wants to see if a neb machine with medication would be cheaper than the Symbicort inhaler.   Advised patient that since she is not currently using a neb, an OV would be needed. Last seen on 09/17/16 and was told to follow up as needed. But she wanted to see if MW would send the order first without a OV. She wants to use [facility].   MW, please advise. Thanks.

## 2017-05-04 MED ORDER — BUDESONIDE-FORMOTEROL FUMARATE 160-4.5 MCG/ACT IN AERO: 2.0000 | INHALATION_SPRAY | Freq: Two times a day (BID) | RESPIRATORY_TRACT | 0 refills | Status: DC

## 2017-05-04 NOTE — Telephone Encounter (Signed)
Since her concern is the cost just give her enough samples of symbicort until can come back for ov to regroup as the meds for the neb can be quite expensive too

## 2017-05-04 NOTE — Telephone Encounter (Signed)
Pt has been scheduled with MW on 06/10/17 @ 12:00 to discuss medication. Pt states she is having eye surgery tomorrow and will be in the hospital until 05/26/17. 3 samples of symbicort have been placed up front for pick up. Pt is aware and voiced her understanding. Nothing further needed.

## 2017-06-09 ENCOUNTER — Other Ambulatory Visit: Payer: Self-pay | Admitting: Gynecology

## 2017-06-09 ENCOUNTER — Other Ambulatory Visit: Payer: Self-pay | Admitting: Internal Medicine

## 2017-06-09 DIAGNOSIS — E2839 Other primary ovarian failure: Secondary | ICD-10-CM

## 2017-06-09 DIAGNOSIS — Z1231 Encounter for screening mammogram for malignant neoplasm of breast: Secondary | ICD-10-CM

## 2017-06-10 ENCOUNTER — Ambulatory Visit: Payer: Commercial Managed Care - HMO | Admitting: Internal Medicine

## 2017-08-12 ENCOUNTER — Other Ambulatory Visit (HOSPITAL_BASED_OUTPATIENT_CLINIC_OR_DEPARTMENT_OTHER): Payer: Commercial Managed Care - HMO

## 2017-08-12 ENCOUNTER — Ambulatory Visit (HOSPITAL_BASED_OUTPATIENT_CLINIC_OR_DEPARTMENT_OTHER): Payer: Commercial Managed Care - HMO

## 2017-10-18 ENCOUNTER — Telehealth: Payer: Self-pay | Admitting: Internal Medicine

## 2017-10-18 NOTE — Telephone Encounter (Signed)
Advised pt samples up front to pick and she can dropoff patient assistance forms to us to fill out. Nothing further is needed.

## 2017-10-18 NOTE — Telephone Encounter (Signed)
Pt called back about samples. Pt is worried about not having any meds for her extended trip.

## 2017-10-18 NOTE — Telephone Encounter (Signed)
ATC pt, no answer. Left message for pt to call back.  

## 2017-10-19 ENCOUNTER — Telehealth: Payer: Self-pay | Admitting: Internal Medicine

## 2017-10-19 NOTE — Telephone Encounter (Signed)
Forms placed in MW cubby.

## 2017-10-21 NOTE — Telephone Encounter (Signed)
Form is in MW's lookat for sig

## 2017-10-21 NOTE — Telephone Encounter (Signed)
Rita Shepherd, have these forms been returned to you?

## 2017-10-22 NOTE — Telephone Encounter (Signed)
Form was done and mailed to the pt   I placed a copy in MW scan folder  Pt aware and nothing further needed

## 2017-11-05 ENCOUNTER — Telehealth: Payer: Self-pay | Admitting: Internal Medicine

## 2017-11-05 NOTE — Telephone Encounter (Signed)
ATC pt, no answer. Left message for pt to call back.  If she needs the assistance forms she can come by to get samples and application,.

## 2017-11-05 NOTE — Telephone Encounter (Signed)
Patient asked us to leave a message if she does not answer.

## 2017-11-10 NOTE — Telephone Encounter (Signed)
Spoke with pt, she states she still did not receive the application from our office. I advised her I had samples of Symbicort and if she came to pick them up I could leave an application in the bag. She agreed and will come by to pick them up. Nothing further is needed.

## 2017-11-12 ENCOUNTER — Telehealth: Payer: Self-pay | Admitting: Internal Medicine

## 2017-11-12 NOTE — Telephone Encounter (Signed)
Pt states that the pt assistance form that was given to pt with her samples was blank and not filled out or signed by MW.   New form has been printed and filled out, placed in MW's look-at for signature.  Pt requests this to be mailed to her home address on file when signed by MW.  Forwarding to LR to look out for.

## 2017-11-15 NOTE — Telephone Encounter (Signed)
Form is currently in MW's cubby and will be signed when he returns to office.

## 2017-11-18 NOTE — Telephone Encounter (Signed)
Forms signed and mailed back to the pt

## 2018-01-27 ENCOUNTER — Telehealth: Payer: Self-pay | Admitting: Internal Medicine

## 2018-01-27 NOTE — Telephone Encounter (Signed)
Spoke with pt. She is requesting samples of Symbicort. We have not seen her since 2017. Advised her that we can't supply her with samples due to not being seen. She verbalized understanding. Nothing further was needed.

## 2018-07-02 ENCOUNTER — Other Ambulatory Visit: Payer: Self-pay

## 2018-07-02 ENCOUNTER — Encounter (HOSPITAL_BASED_OUTPATIENT_CLINIC_OR_DEPARTMENT_OTHER): Payer: Self-pay | Admitting: Respiratory Therapy

## 2018-07-02 ENCOUNTER — Emergency Department (HOSPITAL_BASED_OUTPATIENT_CLINIC_OR_DEPARTMENT_OTHER)
Admission: EM | Admit: 2018-07-02 | Discharge: 2018-07-02 | Disposition: A | Discharge status: Home / Self Care | Payer: Medicare HMO | Attending: Emergency Medicine | Admitting: Emergency Medicine

## 2018-07-02 ENCOUNTER — Emergency Department (HOSPITAL_BASED_OUTPATIENT_CLINIC_OR_DEPARTMENT_OTHER): Payer: Medicare HMO

## 2018-07-02 DIAGNOSIS — J441 Chronic obstructive pulmonary disease with (acute) exacerbation: Secondary | ICD-10-CM | POA: Insufficient documentation

## 2018-07-02 DIAGNOSIS — Z79899 Other long term (current) drug therapy: Secondary | ICD-10-CM | POA: Diagnosis not present

## 2018-07-02 DIAGNOSIS — I10 Essential (primary) hypertension: Secondary | ICD-10-CM | POA: Diagnosis not present

## 2018-07-02 DIAGNOSIS — R05 Cough: Secondary | ICD-10-CM | POA: Diagnosis not present

## 2018-07-02 DIAGNOSIS — F17210 Nicotine dependence, cigarettes, uncomplicated: Secondary | ICD-10-CM | POA: Diagnosis not present

## 2018-07-02 DIAGNOSIS — R0981 Nasal congestion: Secondary | ICD-10-CM | POA: Diagnosis not present

## 2018-07-02 DIAGNOSIS — R059 Cough, unspecified: Secondary | ICD-10-CM

## 2018-07-02 DIAGNOSIS — R0602 Shortness of breath: Secondary | ICD-10-CM | POA: Diagnosis present

## 2018-07-02 HISTORY — DX: Chronic obstructive pulmonary disease, unspecified: J44.9

## 2018-07-02 HISTORY — DX: Bronchitis, not specified as acute or chronic: J40

## 2018-07-02 LAB — CBC
HCT: 34.4 % — ABNORMAL LOW (ref 36.0–46.0)
HEMOGLOBIN: 11.6 g/dL — AB (ref 12.0–15.0)
MCH: 32.5 pg (ref 26.0–34.0)
MCHC: 33.7 g/dL (ref 30.0–36.0)
MCV: 96.4 fL (ref 78.0–100.0)
PLATELETS: 279 10*3/uL (ref 150–400)
RBC: 3.57 MIL/uL — AB (ref 3.87–5.11)
RDW: 13.7 % (ref 11.5–15.5)
WBC: 6.8 10*3/uL (ref 4.0–10.5)

## 2018-07-02 LAB — BASIC METABOLIC PANEL
ANION GAP: 7 (ref 5–15)
BUN: 16 mg/dL (ref 8–23)
CHLORIDE: 103 mmol/L (ref 98–111)
CO2: 27 mmol/L (ref 22–32)
CREATININE: 1.15 mg/dL — AB (ref 0.44–1.00)
Calcium: 9 mg/dL (ref 8.9–10.3)
GFR calc non Af Amer: 44 mL/min — ABNORMAL LOW (ref >60)
GFR, EST AFRICAN AMERICAN: 51 mL/min — AB (ref >60)
Glucose, Bld: 96 mg/dL (ref 70–99)
POTASSIUM: 3.3 mmol/L — AB (ref 3.5–5.1)
SODIUM: 137 mmol/L (ref 135–145)

## 2018-07-02 MED ORDER — AZITHROMYCIN 250 MG PO TABS: 250.0000 mg | ORAL_TABLET | Freq: Every day | ORAL | 0 refills | Status: DC

## 2018-07-02 MED ORDER — ALBUTEROL SULFATE (2.5 MG/3ML) 0.083% IN NEBU
2.5000 mg | INHALATION_SOLUTION | Freq: Once | RESPIRATORY_TRACT | Status: AC
  Administered 2018-07-02: 2.5 mg via RESPIRATORY_TRACT

## 2018-07-02 MED ORDER — OXYMETAZOLINE HCL 0.05 % NA SOLN: 2.0000 | Freq: Two times a day (BID) | NASAL | 0 refills | Status: AC

## 2018-07-02 MED ORDER — PREDNISONE 20 MG PO TABS: ORAL_TABLET | ORAL | 0 refills | Status: DC

## 2018-07-02 MED ORDER — LORATADINE 10 MG PO TABS: 10.0000 mg | ORAL_TABLET | Freq: Every day | ORAL | 0 refills | Status: AC

## 2018-07-02 MED ORDER — METHYLPREDNISOLONE SODIUM SUCC 125 MG IJ SOLR
125.0000 mg | Freq: Once | INTRAMUSCULAR | Status: AC
  Administered 2018-07-02: 125 mg via INTRAVENOUS
  Filled 2018-07-02: qty 2

## 2018-07-02 MED ORDER — ALBUTEROL SULFATE (2.5 MG/3ML) 0.083% IN NEBU
5.0000 mg | INHALATION_SOLUTION | Freq: Once | RESPIRATORY_TRACT | Status: AC
  Administered 2018-07-02: 5 mg via RESPIRATORY_TRACT
  Filled 2018-07-02: qty 6

## 2018-07-02 MED ORDER — IPRATROPIUM-ALBUTEROL 0.5-2.5 (3) MG/3ML IN SOLN
RESPIRATORY_TRACT | Status: AC
  Administered 2018-07-02: 3 mL via RESPIRATORY_TRACT
  Filled 2018-07-02: qty 3

## 2018-07-02 MED ORDER — IPRATROPIUM-ALBUTEROL 0.5-2.5 (3) MG/3ML IN SOLN
3.0000 mL | Freq: Once | RESPIRATORY_TRACT | Status: AC
  Administered 2018-07-02: 3 mL via RESPIRATORY_TRACT

## 2018-07-02 MED ORDER — ALBUTEROL SULFATE (2.5 MG/3ML) 0.083% IN NEBU
INHALATION_SOLUTION | RESPIRATORY_TRACT | Status: AC
  Administered 2018-07-02: 2.5 mg via RESPIRATORY_TRACT
  Filled 2018-07-02: qty 3

## 2018-07-02 MED ORDER — FLUTICASONE PROPIONATE 50 MCG/ACT NA SUSP: 2.0000 | Freq: Every day | NASAL | 0 refills | Status: AC

## 2018-07-02 NOTE — ED Notes (Signed)
ED Provider at bedside. 

## 2018-07-02 NOTE — ED Triage Notes (Signed)
Patient states that she has had SOB and cough x 2 -3 days

## 2018-07-02 NOTE — ED Provider Notes (Signed)
[facility] EMERGENCY DEPARTMENT Provider Note   CSN: [id number] Arrival date & time: 07/02/18  1219     History   Chief Complaint Chief Complaint  Patient presents with  . Shortness of Breath    HPI Rita HertzBarbara G Shepherd is a 83 y.o. female.  HPI Patient presents with 3 days of nasal congestion, chest congestion, productive cough, shortness of breath.  States she is been using a nebulizer at home.  She is up to follow-up with her pulmonologist on Monday.  She denies any lower extremity swelling or pain.  She has had occasional chills and sweats.  Denies any chest pain. Past Medical History:  Diagnosis Date  . Bronchitis   . COPD (chronic obstructive pulmonary disease) (HCC)   . Hypertension     Patient Active Problem List   Diagnosis Date Noted  . Upper airway cough syndrome 07/04/2018  . HBP (high blood pressure) 06/17/2013  . COPD GOLD 1  05/08/2013  . Cigarette smoker 05/08/2013    Past Surgical History:  Procedure Laterality Date  . VESICOVAGINAL FISTULA CLOSURE W/ TAH       OB History   None      Home Medications    Prior to Admission medications   Medication Sig Start Date End Date Taking? Authorizing Provider  traZODone (DESYREL) 50 MG tablet TK 1 T PO HS 04/22/18  Yes [provider]  albuterol (PROAIR HFA) 108 (90 BASE) MCG/ACT inhaler Inhale 2 puffs into the lungs every 6 (six) hours as needed for wheezing or shortness of breath.    [provider]  amLODipine (NORVASC) 5 MG tablet Take 5 mg by mouth daily.    [provider]  azithromycin (ZITHROMAX) 250 MG tablet Take 1 tablet (250 mg total) by mouth daily. Take first 2 tablets together, then 1 every day until finished. 07/02/18   [name], [name], MD  Besifloxacin HCl 0.6 % SUSP 1 drop.    [provider]  budesonide-formoterol (SYMBICORT) 160-4.5 MCG/ACT inhaler Inhale 2 puffs into the lungs 2 (two) times daily. 05/04/17 05/05/17  [name], [name], MD    Cholecalciferol (VITAMIN D) 2000 UNITS CAPS Take 1 capsule by mouth daily.    [provider]  dextromethorphan-guaiFENesin (MUCINEX DM) 30-600 MG per 12 hr tablet Take 1 tablet by mouth every 12 (twelve) hours as needed.    [provider]  docusate calcium (SURFAK) 240 MG capsule Take by mouth.    [provider]  docusate sodium (COLACE) 100 MG capsule Take 100 mg by mouth at bedtime.    [provider]  fluticasone (FLONASE) 50 MCG/ACT nasal spray Place 2 sprays into both nostrils daily. 07/02/18   [name], Rita Scheirer, MD  Homeopathic Products ([employer] LEG CRAMPS PAIN RELIEF) TABS Take 1 capsule by mouth daily as needed.    [provider]  Hypertonic Nasal Wash (SINUS RINSE NA) Place into the nose 2 (two) times daily as needed.    [provider]  loratadine (CLARITIN) 10 MG tablet Take 1 tablet (10 mg total) by mouth daily. 07/02/18   [name], Rita Arambula, MD  losartan-hydrochlorothiazide (HYZAAR) 100-12.5 MG per tablet Take 1 tablet by mouth daily.    [provider]  magnesium oxide (MAG-OX) 400 MG tablet Take by mouth.    [provider]  omega-3 acid ethyl esters (LOVAZA) 1 g capsule Take by mouth.    [provider]  Omega-3 Fatty Acids (FISH OIL PO) Take 1 capsule by mouth daily.  [provider]  oxymetazoline (AFRIN NASAL SPRAY) 0.05 % nasal spray Place 2 sprays into both nostrils 2 (two) times daily for 3 days. 07/02/18 07/05/18  [name], Graylee Arutyunyan, MD  Potassium 99 MG TABS Take by mouth.    [provider]  predniSONE (DELTASONE) 20 MG tablet 3 tabs po day one, then 2 po daily x 4 days 07/03/18   [name], Rita Vanosdol, MD  Probiotic Product (HEALTHY COLON) CAPS Take 1 capsule by mouth daily.    [provider]  vitamin E 400 UNIT capsule Take 400 Units by mouth daily.    [provider]    Family History Family History  Problem Relation Age of Onset  . Prostate cancer Father   .  Prostate cancer Brother     Social History Social History   Tobacco Use  . Smoking status: Current Some Day Smoker    Years: 50.00    Types: Cigarettes  . Smokeless tobacco: Never Used  . Tobacco comment: smokes "a couple of puffs every day or so" and using nicorette gum  Substance Use Topics  . Alcohol use: No  . Drug use: No     Allergies   Contrast media [iodinated diagnostic agents]   Review of Systems Review of Systems  Constitutional: Positive for chills, diaphoresis and fatigue. Negative for fever.  HENT: Positive for congestion and sinus pressure. Negative for facial swelling and sore throat.   Respiratory: Positive for cough, shortness of breath and wheezing.   Cardiovascular: Negative for chest pain, palpitations and leg swelling.  Gastrointestinal: Negative for abdominal pain, constipation, diarrhea, nausea and vomiting.  Genitourinary: Negative for dysuria and flank pain.  Musculoskeletal: Negative for back pain, myalgias and neck pain.  Skin: Negative for rash and wound.  Neurological: Positive for headaches. Negative for dizziness, weakness, light-headedness and numbness.  All other systems reviewed and are negative.    Physical Exam Updated Vital Signs BP (!) 146/89   Pulse 79   Temp 98.6 F (37 C)   Resp 20   Ht 5\' 6"  (1.676 m)   Wt 59 kg   SpO2 92%   BMI 20.98 kg/m   Physical Exam  Constitutional: She is oriented to person, place, and time. She appears well-developed and well-nourished. No distress.  HENT:  Head: Normocephalic and atraumatic.  Mouth/Throat: Oropharynx is clear and moist. No oropharyngeal exudate.  Bilateral nasal mucosal edema.  Oropharynx is clear.  Eyes: Pupils are equal, round, and reactive to light. EOM are normal.  Neck: Normal range of motion. Neck supple. No JVD present.  No stridor.  Cardiovascular: Normal rate and regular rhythm. Exam reveals no gallop and no friction rub.  No murmur heard. Pulmonary/Chest: Effort  normal. No respiratory distress. She has wheezes. She has no rales.  Expiratory wheezing in bilateral upper lung fields.  Abdominal: Soft. Bowel sounds are normal. She exhibits no distension and no mass. There is no tenderness. There is no rebound and no guarding. No hernia.  Musculoskeletal: Normal range of motion. She exhibits no edema or tenderness.  No lower extremity swelling, asymmetry or tenderness.  Distal pulses intact.  Lymphadenopathy:    She has no cervical adenopathy.  Neurological: She is alert and oriented to person, place, and time.  Moving all extremities without focal deficit.  Sensation intact.  Skin: Skin is warm and dry. Capillary refill takes less than 2 seconds. No rash noted. No erythema.  Psychiatric: She has a normal mood and affect. Her behavior is normal.  Nursing  note and vitals reviewed.    ED Treatments / Results  Labs (all labs ordered are listed, but only abnormal results are displayed) Labs Reviewed  BASIC METABOLIC PANEL - Abnormal; Notable for the following components:      Result Value   Potassium 3.3 (*)    Creatinine, Ser 1.15 (*)    GFR calc non Af Amer 44 (*)    GFR calc Af Amer 51 (*)    All other components within normal limits  CBC - Abnormal; Notable for the following components:   RBC 3.57 (*)    Hemoglobin 11.6 (*)    HCT 34.4 (*)    All other components within normal limits    EKG EKG Interpretation  Date/Time:  Saturday July 02 2018 13:44:10 EDT Ventricular Rate:  67 PR Interval:    QRS Duration: 82 QT Interval:  417 QTC Calculation: 441 R Axis:   -24 Text Interpretation:  Sinus rhythm Borderline left axis deviation Low voltage, precordial leads Probable anteroseptal infarct, old Confirmed by Loren Racer ([id number]) on 07/02/2018 2:09:14 PM   Radiology No results found.  Procedures Procedures (including critical care time)  Medications Ordered in ED Medications  ipratropium-albuterol (DUONEB) 0.5-2.5 (3) MG/3ML  nebulizer solution 3 mL (3 mLs Nebulization Given 07/02/18 1241)  albuterol (PROVENTIL) (2.5 MG/3ML) 0.083% nebulizer solution 2.5 mg (2.5 mg Nebulization Given 07/02/18 1241)  methylPREDNISolone sodium succinate (SOLU-MEDROL) 125 mg/2 mL injection 125 mg (125 mg Intravenous Given 07/02/18 1435)  albuterol (PROVENTIL) (2.5 MG/3ML) 0.083% nebulizer solution 5 mg (5 mg Nebulization Given 07/02/18 1447)     Initial Impression / Assessment and Plan / ED Course  I have reviewed the triage vital signs and the nursing notes.  Pertinent labs & imaging results that were available during my care of the patient were reviewed by me and considered in my medical decision making (see chart for details).     Patient states she is feeling much better after second nebulizer treatment.  Will treat for COPD exacerbation and nasal sinus congestion.  Return precautions given.  Final Clinical Impressions(s) / ED Diagnoses   Final diagnoses:  Cough  COPD exacerbation (HCC)  Nasal sinus congestion    ED Discharge Orders         Ordered    predniSONE (DELTASONE) 20 MG tablet     07/02/18 1522    fluticasone (FLONASE) 50 MCG/ACT nasal spray  Daily     07/02/18 1522    oxymetazoline (AFRIN NASAL SPRAY) 0.05 % nasal spray  2 times daily     07/02/18 1522    azithromycin (ZITHROMAX) 250 MG tablet  Daily,   Status:  Discontinued     07/02/18 1522    loratadine (CLARITIN) 10 MG tablet  Daily     07/02/18 1522    azithromycin (ZITHROMAX) 250 MG tablet  Daily     07/02/18 1523           Loren Racer, MD 07/05/18 1630

## 2018-07-04 ENCOUNTER — Ambulatory Visit: Payer: Medicare HMO | Admitting: Internal Medicine

## 2018-07-04 ENCOUNTER — Encounter: Payer: Self-pay | Admitting: Internal Medicine

## 2018-07-04 VITALS — BP 108/72 | HR 78 | Ht 66.0 in | Wt 135.0 lb

## 2018-07-04 DIAGNOSIS — R059 Cough, unspecified: Secondary | ICD-10-CM

## 2018-07-04 DIAGNOSIS — R05 Cough: Secondary | ICD-10-CM | POA: Diagnosis not present

## 2018-07-04 DIAGNOSIS — J449 Chronic obstructive pulmonary disease, unspecified: Secondary | ICD-10-CM | POA: Diagnosis not present

## 2018-07-04 DIAGNOSIS — R058 Other specified cough: Secondary | ICD-10-CM

## 2018-07-04 DIAGNOSIS — F17210 Nicotine dependence, cigarettes, uncomplicated: Secondary | ICD-10-CM | POA: Diagnosis not present

## 2018-07-04 MED ORDER — BUDESONIDE-FORMOTEROL FUMARATE 160-4.5 MCG/ACT IN AERO: 2.0000 | INHALATION_SPRAY | Freq: Two times a day (BID) | RESPIRATORY_TRACT | 6 refills | Status: DC

## 2018-07-04 NOTE — Progress Notes (Signed)
Subjective:    Patient ID: Rita Shepherd, female    DOB: 01/13/1935 MRN: [id number]   Brief patient profile:  [id number]  yobf active sm[facility] maintained on combivent and proaire and referred by Dr Margaretmary BayleyPreston Clark 05/05/13 to pulmonary clinic.   History of Present Illness  05/05/2013 1st pulmonary eval/ Aqueelah Cotrell  With indolent progressive doe since Sept 2013 assoc with mostly rattling but mostly non prod cough/ hoarseness while on prednisone at 20 mg qod as a floor. Presently ok walking flat and slow but sob with inclines and walking fast more than 100 ft or so. No problem at rest.  Some Better p combivent or proaire rec symbicort 160 Take 2 puffs first thing in am and then another 2 puffs about 12 hours later.  Supplement as needed for breathing with proaire 2 puffs every 4 hours Work on inhaler technique:    The key is to stop smoking completely before smoking completely stops you!   06/16/2013 f/u ov/Stephie Xu re GOLD I copd Chief Complaint  Patient presents with  . Followup with PFT    Pt reports breathing is much improved since her last visit. No new co's today.   rare need for saba / min smoking  rec Continue symbicort 160 Take 2 puffs first thing in am and then another 2 puffs about 12 hours later. Only use your albuterol as a rescue medication to be used if you can't catch your breath by resting or doing a relaxed purse lip breathing pattern. The less you use it, the better it will work when you need it.    07/28/2016  Extended  f/u ov/Ninah Moccio re: re establish care for COPD GOLD I/ chronic rhinitis ? Sinusitis/ still smoking  Chief Complaint  Patient presents with  . Follow-up    Pt last seen Oct 2014 by Dr. Delford FieldWright. Pt is c/o increased SOB and nasal congestion for the past 2 wks. She states she gets SOB with any exertion, even just talking.   nasal congestion assoc with purulent discharg  X 2 weeks assoc with some worse sob on symb 160 2bid with  increasing need for saba Baseline doe = MMRC2 = can't walk a nl pace on a flat grade s sob but does fine slow and flat eg  shopping rec Augmentin 875 mg take one pill twice daily  X 10 days - take at breakfast and supper with large glass of water.  It would help reduce the usual side effects (diarrhea and yeast infections) if you ate cultured yogurt at lunch.  Prednisone 10 mg take  4 each am x 2 days,   2 each am x 2 days,  1 each am x 2 days and stop  Plan A = Automatic = Symbicort 160 Take 2 puffs first thing in am and then another 2 puffs about 12 hours later.  Plan B = Backup Only use your albuterol as a rescue medication The key is to stop smoking completely before smoking completely stops you!  Please schedule a follow up office visit in 6 weeks, call sooner if needed with pfts here - ok to push back     09/17/2016  f/u ov/Nate Perri re: GOLD I copd / symb 160 2 bid very rare saba  Chief Complaint  Patient presents with  . Follow-up    PFT's done today. Breathing has improved on symbicort.    Not limited by breathing from desired activities  But very sedentary  rec Plan A = Automatic = Symbicort 160 Take 2 puffs first thing in am and then another 2 puffs about 12 hours later.  Plan B = Backup Only use your albuterol (Proair) as a rescue medication The key is to stop smoking completely before smoking completely stops you!       07/04/2018 acute extended ov/Aashir Umholtz re: s/p ER eval for ? aecopd on symb 160 2bid/ finishing zpak, pred taper Chief Complaint  Patient presents with  . Acute Visit    SOB at all time she was just in the hospital on 07/02/18. Chest pain for 2 weeks now   She had reduced the symb to one bid x months and did fine and then abruptly  worses since early August 2019 sob  assoc with nasal congestion and cough more than usual > yellow mucus but some better p er rx/ worse in am's assoc with sinus congestion and midline pain ant chest with coughing    No obvious day to  day or daytime variability or assoc  mucus plugs or hemoptysis or cp or chest tightness, subjective wheeze or overt sinus or hb symptoms.    Also denies any obvious fluctuation of symptoms with weather or environmental changes or other aggravating or alleviating factors except as outlined above   No unusual exposure hx or h/o childhood pna/ asthma or knowledge of premature birth.  Current Allergies, Complete Past Medical History, Past Surgical History, Family History, and Social History were reviewed in [facility] electronic [facility].  ROS  The following are not active complaints unless bolded Hoarseness, sore throat, dysphagia, dental problems, itching, sneezing,  nasal congestion or discharge of excess mucus or purulent secretions, ear ache,   fever, chills, sweats, unintended wt loss or wt gain, classically pleuritic or exertional cp,  orthopnea pnd or arm/hand swelling  or leg swelling, presyncope, palpitations, abdominal pain, anorexia, nausea, vomiting, diarrhea  or change in bowel habits or change in bladder habits, change in stools or change in urine, dysuria, hematuria,  rash, arthralgias, visual complaints, headache, numbness, weakness or ataxia or problems with walking or coordination,  change in mood or  memory.        Current Meds  Medication Sig  . albuterol (PROAIR HFA) 108 (90 BASE) MCG/ACT inhaler Inhale 2 puffs into the lungs every 6 (six) hours as needed for wheezing or shortness of breath.  Marland Kitchen. amLODipine (NORVASC) 5 MG tablet Take 5 mg by mouth daily.  Marland Kitchen. azithromycin (ZITHROMAX) 250 MG tablet Take 1 tablet (250 mg total) by mouth daily. Take first 2 tablets together, then 1 every day until finished.  . Besifloxacin HCl 0.6 % SUSP 1 drop.  . Cholecalciferol (VITAMIN D) 2000 UNITS CAPS Take 1 capsule by mouth daily.  Marland Kitchen. dextromethorphan-guaiFENesin (MUCINEX DM) 30-600 MG per 12 hr tablet Take 1 tablet by mouth every 12 (twelve) hours as needed.  . docusate calcium  (SURFAK) 240 MG capsule Take by mouth.  . docusate sodium (COLACE) 100 MG capsule Take 100 mg by mouth at bedtime.  . fluticasone (FLONASE) 50 MCG/ACT nasal spray Place 2 sprays into both nostrils daily.  . Homeopathic Products ([employer] LEG CRAMPS PAIN RELIEF) TABS Take 1 capsule by mouth daily as needed.  . Hypertonic Nasal Wash (SINUS RINSE NA) Place into the nose 2 (two) times daily as needed.  . loratadine (CLARITIN) 10 MG tablet Take 1 tablet (10 mg total) by mouth daily.  Marland Kitchen. losartan-hydrochlorothiazide (HYZAAR) 100-12.5 MG per tablet Take 1 tablet by  mouth daily.  . magnesium oxide (MAG-OX) 400 MG tablet Take by mouth.  . omega-3 acid ethyl esters (LOVAZA) 1 g capsule Take by mouth.  . Omega-3 Fatty Acids (FISH OIL PO) Take 1 capsule by mouth daily.  Marland Kitchen oxymetazoline (AFRIN NASAL SPRAY) 0.05 % nasal spray Place 2 sprays into both nostrils 2 (two) times daily for 3 days.  . Potassium 99 MG TABS Take by mouth.  . predniSONE (DELTASONE) 20 MG tablet 3 tabs po day one, then 2 po daily x 4 days  . Probiotic Product (HEALTHY COLON) CAPS Take 1 capsule by mouth daily.  . traZODone (DESYREL) 50 MG tablet TK 1 T PO HS  . vitamin E 400 UNIT capsule Take 400 Units by mouth daily.                Objective:   Physical Exam  amb bf nad / easily confused with details of care  - pulled advair out as her rescue   07/04/2018        135  .09/17/2016       136   07/28/2016       136         06/16/13 137 lb (62.143 kg)  05/05/13 135 lb (61.236 kg)     Vital signs reviewed - Note on arrival 02 sats  96% on RA    HEENT: nl dentition / oropharynx. Nl external ear canals without cough reflex -  Mild bilateral non-specific turbinate edema     NECK :  without JVD/Nodes/TM/ nl carotid upstrokes bilaterally   LUNGS: no acc muscle use,  Min barrel  contour chest wall with bilateral  Distant bs s audible wheeze and  without cough on insp or exp maneuver and min  Hyperresonant  to  percussion bilaterally      CV:  RRR  no s3 or murmur or increase in P2, and no edema   ABD:  soft and nontender with pos late  insp Hoover's  in the supine position. No bruits or organomegaly appreciated, bowel sounds nl  MS:   Nl gait/  ext warm without deformities, calf tenderness, cyanosis or clubbing No obvious joint restrictions   SKIN: warm and dry without lesions    NEURO:  alert, approp, poor recall recent events/ names of meds/   no motor or cerebellar deficits apparent.             I personally reviewed images and agree with radiology impression as follows:  CXR:   07/02/18 No active cardiopulmonary disease.   Labs ordered/ reviewed:      Chemistry      Component Value Date/Time   NA 137 07/02/2018 1400   K 3.3 (L) 07/02/2018 1400   CL 103 07/02/2018 1400   CO2 27 07/02/2018 1400   BUN 16 07/02/2018 1400   CREATININE 1.15 (H) 07/02/2018 1400      Component Value Date/Time   CALCIUM 9.0 07/02/2018 1400        Lab Results  Component Value Date   WBC 6.8 07/02/2018   HGB 11.6 (L) 07/02/2018   HCT 34.4 (L) 07/02/2018   MCV 96.4 07/02/2018   PLT 279 07/02/2018      ekg  07/02/18  Nsr/ wnl     Assessment & Plan:

## 2018-07-04 NOTE — Patient Instructions (Addendum)
Continue symbicort 160 Take 2 puffs first thing in am and then another 2 puffs about 12 hours later.    Please see patient coordinator before you leave today  to schedule sinus CT    Please schedule a follow up office visit in 2  weeks, sooner if needed  with all medications /inhalers/ solutions in hand so we can verify exactly what you are taking. This includes all medications from all doctors and over the counters

## 2018-07-05 ENCOUNTER — Encounter: Payer: Self-pay | Admitting: Internal Medicine

## 2018-07-05 NOTE — Assessment & Plan Note (Signed)

## 2018-07-05 NOTE — Assessment & Plan Note (Signed)
Sinus CT 07/04/2018 >>>   Upper airway cough syndrome (previously labeled PNDS),  is so named because it's frequently impossible to sort out how much is  CR/sinusitis with freq throat clearing (which can be related to primary GERD)   vs  causing  secondary (" extra esophageal")  GERD from wide swings in gastric pressure that occur with throat clearing, often  promoting self use of mint and menthol lozenges that reduce the lower esophageal sphincter tone and exacerbate the problem further in a cyclical fashion.   These are the same pts (now being labeled as having "irritable larynx syndrome" by some cough centers) who not infrequently have a history of having failed to tolerate ace inhibitors,  dry powder inhalers or biphosphonates or report having atypical/extraesophageal reflux symptoms that don't respond to standard doses of PPI  and are easily confused as having aecopd or asthma flares by even experienced allergists/ pulmonologists (myself included).

## 2018-07-05 NOTE — Assessment & Plan Note (Addendum)
-   PFT's 06/16/13  FEV1 1.57 (84%) ratio 57  p 24% resp to B2,  ratio 58 and DLCO 45 corrrects 57%  - 06/18/2014 advised of ? Adverse effect of symbicort by [place] and returned fax - notified pt if having problems needs to return if wants our input - 09/17/2016  After extensive coaching HFA effectiveness =   90% - PFT's  09/17/2016  FEV1 1.79  (101 % ) ratio 64  p 19 % improvement from saba p nothing prior to study with DLCO  43/44c % corrects to 57  % for alv volume   - Spirometry 07/04/2018  FEV1 1.23  (71%)  Ratio 70 with mild curvature    Really don't see much now to support a typical aecopd but note baseline use/ understanding of rx with hfa products quite poor so I can see how a relatively minor uri or upper airway cough source could cause considerable symptoms/ decompensation    rec maint sym 160 2bid/ use saba prn and f/u here in 2 weeks to regroup with  with all meds in hand using a trust but verify approach to confirm accurate Medication  Reconciliation The principal here is that until we are certain that the  patients are doing what we've asked, it makes no sense to ask them to do more.     I had an extended discussion with the patient reviewing all relevant studies completed to date and  lasting 25 minutes of a 40  minute acute office visit /transition of care from ER to re-establish with me and  Address  ongoing  non-specific but potentially very serious refractory respiratory symptoms of uncertain and potentially multiple  etiologies.   Each maintenance medication was reviewed in detail including most importantly the difference between maintenance and prns and under what circumstances the prns are to be triggered using an action plan format that is not reflected in the computer generated alphabetically organized AVS.    Please see AVS for specific instructions unique to this office visit that I personally wrote and verbalized to the the pt in detail and then reviewed with pt  by my nurse  highlighting any changes in therapy/plan of care  recommended at today's visit.

## 2018-07-08 ENCOUNTER — Other Ambulatory Visit: Payer: Self-pay

## 2018-07-08 ENCOUNTER — Ambulatory Visit (HOSPITAL_BASED_OUTPATIENT_CLINIC_OR_DEPARTMENT_OTHER)
Admission: RE | Admit: 2018-07-08 | Discharge: 2018-07-08 | Disposition: A | Discharge status: Home / Self Care | Payer: Medicare HMO | Source: Ambulatory Visit | Attending: Internal Medicine | Admitting: Internal Medicine

## 2018-07-08 DIAGNOSIS — R058 Other specified cough: Secondary | ICD-10-CM

## 2018-07-08 DIAGNOSIS — R05 Cough: Secondary | ICD-10-CM | POA: Insufficient documentation

## 2018-07-08 DIAGNOSIS — J019 Acute sinusitis, unspecified: Secondary | ICD-10-CM | POA: Diagnosis not present

## 2018-07-08 DIAGNOSIS — R059 Cough, unspecified: Secondary | ICD-10-CM

## 2018-07-08 MED ORDER — AMOXICILLIN-POT CLAVULANATE 875-125 MG PO TABS: 1.0000 | ORAL_TABLET | Freq: Two times a day (BID) | ORAL | 0 refills | Status: DC

## 2018-07-21 ENCOUNTER — Ambulatory Visit: Payer: Medicare HMO | Admitting: Internal Medicine

## 2018-07-25 ENCOUNTER — Ambulatory Visit: Payer: Medicare HMO | Admitting: Internal Medicine

## 2018-07-25 ENCOUNTER — Encounter: Payer: Self-pay | Admitting: Internal Medicine

## 2018-07-25 VITALS — BP 122/70 | HR 75 | Ht 66.0 in | Wt 124.0 lb

## 2018-07-25 DIAGNOSIS — J449 Chronic obstructive pulmonary disease, unspecified: Secondary | ICD-10-CM

## 2018-07-25 DIAGNOSIS — J320 Chronic maxillary sinusitis: Secondary | ICD-10-CM

## 2018-07-25 DIAGNOSIS — R05 Cough: Secondary | ICD-10-CM

## 2018-07-25 DIAGNOSIS — F17210 Nicotine dependence, cigarettes, uncomplicated: Secondary | ICD-10-CM

## 2018-07-25 DIAGNOSIS — R059 Cough, unspecified: Secondary | ICD-10-CM

## 2018-07-25 MED ORDER — BUDESONIDE-FORMOTEROL FUMARATE 160-4.5 MCG/ACT IN AERO: INHALATION_SPRAY | RESPIRATORY_TRACT | 11 refills | Status: DC

## 2018-07-25 NOTE — Patient Instructions (Addendum)
No mint or menthol products   GERD (REFLUX)  is an extremely common cause of respiratory symptoms just like yours , many times with no obvious heartburn at all.    It can be treated with medication, but also with lifestyle changes including elevation of the head of your bed (ideally with 6 inch  bed blocks),  Smoking cessation, avoidance of late meals, excessive alcohol, and avoid fatty foods, chocolate, peppermint, colas, red wine, and acidic juices such as orange juice.  NO MINT OR MENTHOL PRODUCTS SO NO COUGH DROPS USE   CANDY INSTEAD (Jolley ranchers or Stover's or Life Savers - all come sugarless if you want) or even ice chips will also do - the key is to swallow to prevent all throat clearing. NO OIL BASED VITAMINS - use powdered substitutes.   Please see patient coordinator before you leave today  to schedule re-sinus CT in one week - no sooner - and I will call to refer you to a sinus specialist    Please schedule a follow up visit in 3 months but call sooner if needed to see Dr Vassie Loll or NP to re-establish  at the [facility] pulmonary office

## 2018-07-25 NOTE — Progress Notes (Signed)
Subjective:    Patient ID: Rita FlingBarbara Shepherd, female    DOB: 01/13/1935 MRN: [id number]   Brief patient profile:  [id number]  yobf active smoker [facility] maintained on combivent and proaire and referred by Dr Margaretmary BayleyPreston Clark 05/05/13 to pulmonary clinic.   History of Present Illness  05/05/2013 1st pulmonary eval/ Rita Shepherd  With indolent progressive doe since Sept 2013 assoc with mostly rattling but mostly non prod cough/ hoarseness while on prednisone at 20 mg qod as a floor. Presently ok walking flat and slow but sob with inclines and walking fast more than 100 ft or so. No problem at rest.  Some Better p combivent or proaire rec symbicort 160 Take 2 puffs first thing in am and then another 2 puffs about 12 hours later.  Supplement as needed for breathing with proaire 2 puffs every 4 hours Work on inhaler technique:    The key is to stop smoking completely before smoking completely stops you!   06/16/2013 f/u ov/Rita Shepherd re GOLD I copd Chief Complaint  Patient presents with  . Followup with PFT    Pt reports breathing is much improved since her last visit. No new co's today.   rare need for saba / min smoking  rec Continue symbicort 160 Take 2 puffs first thing in am and then another 2 puffs about 12 hours later. Only use your albuterol as a rescue medication to be used if you can't catch your breath by resting or doing a relaxed purse lip breathing pattern. The less you use it, the better it will work when you need it.    07/28/2016  Extended  f/u ov/Rita Shepherd re: re establish care for COPD GOLD I/ chronic rhinitis ? Sinusitis/ still smoking  Chief Complaint  Patient presents with  . Follow-up    Pt last seen Oct 2014 by Dr. Delford FieldWright. Pt is c/o increased SOB and nasal congestion for the past 2 wks. She states she gets SOB with any exertion, even just talking.   nasal congestion assoc with purulent discharg  X 2 weeks assoc with some worse sob on symb 160 2bid with  increasing need for saba Baseline doe = MMRC2 = can't walk a nl pace on a flat grade s sob but does fine slow and flat eg  shopping rec Augmentin 875 mg take one pill twice daily  X 10 days - take at breakfast and supper with large glass of water.  It would help reduce the usual side effects (diarrhea and yeast infections) if you ate cultured yogurt at lunch.  Prednisone 10 mg take  4 each am x 2 days,   2 each am x 2 days,  1 each am x 2 days and stop  Plan A = Automatic = Symbicort 160 Take 2 puffs first thing in am and then another 2 puffs about 12 hours later.  Plan B = Backup Only use your albuterol as a rescue medication The key is to stop smoking completely before smoking completely stops you!  Please schedule a follow up office visit in 6 weeks, call sooner if needed with pfts here - ok to push back     09/17/2016  f/u ov/Rita Shepherd re: GOLD I copd / symb 160 2 bid very rare saba  Chief Complaint  Patient presents with  . Follow-up    PFT's done today. Breathing has improved on symbicort.    Not limited by breathing from desired activities  But very sedentary  rec Plan A = Automatic = Symbicort 160 Take 2 puffs first thing in am and then another 2 puffs about 12 hours later.  Plan B = Backup Only use your albuterol (Proair) as a rescue medication The key is to stop smoking completely before smoking completely stops you!       07/04/2018 acute extended ov/Rita Shepherd re: s/p ER eval for ? aecopd on symb 160 2bid/ finishing zpak, pred taper Chief Complaint  Patient presents with  . Acute Visit    SOB at all time she was just in the hospital on 07/02/18. Chest pain for 2 weeks now   She had reduced the symb to one bid x months and did fine and then abruptly  worses since early August 2019 sob  assoc with nasal congestion and cough more than usual > yellow mucus but some better p er rx/ worse in am's assoc with sinus congestion and midline pain ant chest with coughing   rec Continue  symbicort 160 Take 2 puffs first thing in am and then another 2 puffs about 12 hours later.   schedule   Sinus CT 07/08/2018 >>> 1. Acute sinusitis with bilateral maxillary sinus fluid levels, mild mucosal thickening and bubbly opacity elsewhere.> augmentin 875 bid x 20 days then sinus ct and if not cleared Please schedule a follow up office visit in 2  weeks, sooner if needed  with all medications /inhalers/ solutions in hand so we can verify exactly what you are taking. This includes all medications from all doctors and over the counters     07/25/2018  f/u ov/Rita Shepherd re: copd I / sinusitis  Chief Complaint  Patient presents with  . Follow-up    2 week f/u for COPD. Patient states she has been doing ok since last visit. She believes she developed thrush on her tongue. Denies any mouth pain.   Dyspnea:  [employer] ok  Cough: am/ also hs  Sleeping: feq wakendng SABA use:  Not using 02: none     No obvious day to day or daytime variability or assoc excess/ purulent sputum or mucus plugs or hemoptysis or cp or chest tightness, subjective wheeze or overt sinus or hb symptoms.    Also denies any obvious fluctuation of symptoms with weather or environmental changes or other aggravating or alleviating factors except as outlined above   No unusual exposure hx or h/o childhood pna/ asthma or knowledge of premature birth.  Current Allergies, Complete Past Medical History, Past Surgical History, Family History, and Social History were reviewed in [employer] record.  ROS  The following are not active complaints unless bolded Hoarseness, sore throat, dysphagia, dental problems, itching, sneezing,  nasal congestion or discharge of excess mucus or purulent secretions, ear ache,   fever, chills, sweats, unintended wt loss or wt gain, classically pleuritic or exertional cp,  orthopnea pnd or arm/hand swelling  or leg swelling, presyncope, palpitations, abdominal pain, anorexia, nausea,  vomiting, diarrhea  or change in bowel habits or change in bladder habits, change in stools or change in urine, dysuria, hematuria,  rash, arthralgias, visual complaints, headache, numbness, weakness or ataxia or problems with walking or coordination,  change in mood or  memory.        Current Meds  Medication Sig  . albuterol (PROAIR HFA) 108 (90 BASE) MCG/ACT inhaler Inhale 2 puffs into the lungs every 6 (six) hours as needed for wheezing or shortness of breath.  Marland Kitchen amLODipine (NORVASC) 5 MG tablet Take  5 mg by mouth daily.  Marland Kitchen Besifloxacin HCl 0.6 % SUSP 1 drop.  . Cholecalciferol (VITAMIN D) 2000 UNITS CAPS Take 1 capsule by mouth daily.  Marland Kitchen dextromethorphan-guaiFENesin (MUCINEX DM) 30-600 MG per 12 hr tablet Take 1 tablet by mouth every 12 (twelve) hours as needed.  . docusate calcium (SURFAK) 240 MG capsule Take by mouth.  . docusate sodium (COLACE) 100 MG capsule Take 100 mg by mouth at bedtime.  . fluticasone (FLONASE) 50 MCG/ACT nasal spray Place 2 sprays into both nostrils daily.  . Homeopathic Products ([employer] LEG CRAMPS PAIN RELIEF) TABS Take 1 capsule by mouth daily as needed.  . Hypertonic Nasal Wash (SINUS RINSE NA) Place into the nose 2 (two) times daily as needed.  . loratadine (CLARITIN) 10 MG tablet Take 1 tablet (10 mg total) by mouth daily.  Marland Kitchen losartan-hydrochlorothiazide (HYZAAR) 100-12.5 MG per tablet Take 1 tablet by mouth daily.  . magnesium oxide (MAG-OX) 400 MG tablet Take by mouth.  . omega-3 acid ethyl esters (LOVAZA) 1 g capsule Take by mouth.  . Omega-3 Fatty Acids (FISH OIL PO) Take 1 capsule by mouth daily.  . Potassium 99 MG TABS Take by mouth.  . Probiotic Product (HEALTHY COLON) CAPS Take 1 capsule by mouth daily.  . traZODone (DESYREL) 50 MG tablet TK 1 T PO HS  . vitamin E 400 UNIT capsule Take 400 Units by mouth daily.                  Objective:   Physical Exam   amb bf slt hoarse    07/25/2018          124  07/04/2018        135  .09/17/2016        136   07/28/2016       136         06/16/13 137 lb (62.143 kg)  05/05/13 135 lb (61.236 kg)     Vital signs reviewed - Note on arrival 02 sats  97% on RA         HEENT: nl dentition / oropharynx with min thrush . Nl external ear canals without cough reflex -  Mild bilateral non-specific turbinate edema     NECK :  without JVD/Nodes/TM/ nl carotid upstrokes bilaterally   LUNGS: no acc muscle use,  Mild barrel  contour chest wall with bilateral  Distant bs s audible wheeze and  without cough on insp or exp maneuver and mild  Hyperresonant  to  percussion bilaterally     CV:  RRR  no s3 or murmur or increase in P2, and no edema   ABD:  soft and nontender with pos mid insp Hoover's  in the supine position. No bruits or organomegaly appreciated, bowel sounds nl  MS:   Nl gait/  ext warm without deformities, calf tenderness, cyanosis or clubbing No obvious joint restrictions   SKIN: warm and dry without lesions    NEURO:  alert, approp, nl sensorium with  no motor or cerebellar deficits apparent.                    Assessment & Plan:

## 2018-07-26 ENCOUNTER — Encounter: Payer: Self-pay | Admitting: Internal Medicine

## 2018-07-26 NOTE — Assessment & Plan Note (Signed)
-   PFT's 06/16/13  FEV1 1.57 (84%) ratio 57  p 24% resp to B2,  ratio 58 and DLCO 45 corrrects 57%  - 06/18/2014 advised of ? Adverse effect of symbicort by [place] and [place] fax - notified pt if having problems needs to return if wants our input - PFT's  09/17/2016  FEV1 1.79  (101 % ) ratio 64  p 19 % improvement from saba p nothing prior to study with DLCO  43/44c % corrects to 57  % for alv volume   - Spirometry 07/04/2018  FEV1 1.23  (71%)  Ratio 70 with mild curvature    - 07/25/2018  After extensive coaching inhaler device,  effectiveness =   75% (short ti)   No change in rx needed - the ab component is relatively well controlled despite uacs related to sinusitis - see sep a/p

## 2018-07-26 NOTE — Assessment & Plan Note (Signed)
Advised against even casual or "once a day smoking" in this setting

## 2018-07-26 NOTE — Assessment & Plan Note (Signed)
Sinus CT 07/08/2018 >>> 1. Acute sinusitis with bilateral maxillary sinus fluid levels, mild mucosal thickening and bubbly opacity elsewhere.> augmentin 875 bid x 20 days then sinus ct and if not cleared > ent eval if not cleared (scheduled for 08/03/18)   I had an extended discussion with the patient reviewing all relevant studies completed to date and  lasting 15 to 20 minutes of a 25 minute visit    See device teaching which extended face to face time for this visit.  Each maintenance medication was reviewed in detail including emphasizing most importantly the difference between maintenance and prns and under what circumstances the prns are to be triggered using an action plan format that is not reflected in the computer generated alphabetically organized AVS which I have not found useful in most complex patients, especially with respiratory illnesses  Please see AVS for specific instructions unique to this visit that I personally wrote and verbalized to the the pt in detail and then reviewed with pt  by my nurse highlighting any  changes in therapy recommended at today's visit to their plan of care.

## 2018-08-03 ENCOUNTER — Ambulatory Visit (HOSPITAL_BASED_OUTPATIENT_CLINIC_OR_DEPARTMENT_OTHER): Payer: Medicare HMO

## 2018-08-12 ENCOUNTER — Telehealth: Payer: Self-pay | Admitting: Internal Medicine

## 2018-08-12 NOTE — Telephone Encounter (Signed)
Patient mailed financial documents to be sent to [facility] for her Symbicort 160. Completed our portion of the form and faxed it back to [employer].    ATC patient to make her aware but she did not answer and VM was not setup. Will ATC back later.

## 2018-09-12 ENCOUNTER — Telehealth: Payer: Self-pay | Admitting: Internal Medicine

## 2018-09-12 DIAGNOSIS — J449 Chronic obstructive pulmonary disease, unspecified: Secondary | ICD-10-CM

## 2018-09-12 NOTE — Telephone Encounter (Signed)
Spoke with pt, she wants to hold off on sending this Rx in until next week. She states she has enough and will call back for refill. Nothing further is needed.

## 2018-09-21 ENCOUNTER — Telehealth: Payer: Self-pay | Admitting: Internal Medicine

## 2018-09-21 NOTE — Telephone Encounter (Signed)
Called and spoke with patient, she stated that she will bring in the information to attach to new forms. Patient will be in at the end of next week. Forms have been printed and placed up front.

## 2018-09-28 ENCOUNTER — Telehealth: Payer: Self-pay | Admitting: Internal Medicine

## 2018-09-28 NOTE — Telephone Encounter (Signed)
Kadish from [employer] [phone] fax # [phone]) stated they have not received the forms for cymbacort prescription assistance. All information is too far out, over a year.

## 2018-09-28 NOTE — Telephone Encounter (Signed)
Called patient (see other phone note about this) unable to reach left message to give us a call back.

## 2018-09-29 NOTE — Telephone Encounter (Signed)
Called and spoke with pt letting her know that we had received a call from [employer] stating that her patient assistance forms for the symbicort were outdated. I stated to her to call [employer] to see what needed to be done about it as pt was unsure why she had not received a call from them in regards to this information.  I gave pt the phone number for [employer] patient assistance for her to call. Nothing further needed.

## 2018-11-01 ENCOUNTER — Ambulatory Visit: Payer: Medicare HMO | Admitting: Pulmonary Disease

## 2019-03-31 IMAGING — CR DG CHEST 2V
2 series · 2 of 2 positions shown · non-contrast
Comparison: 07/28/2016

CLINICAL DATA: Congestion with productive cough and
shortness-of-breath 3-4 days.

EXAM:
CHEST - 2 VIEW

[w chest pa]
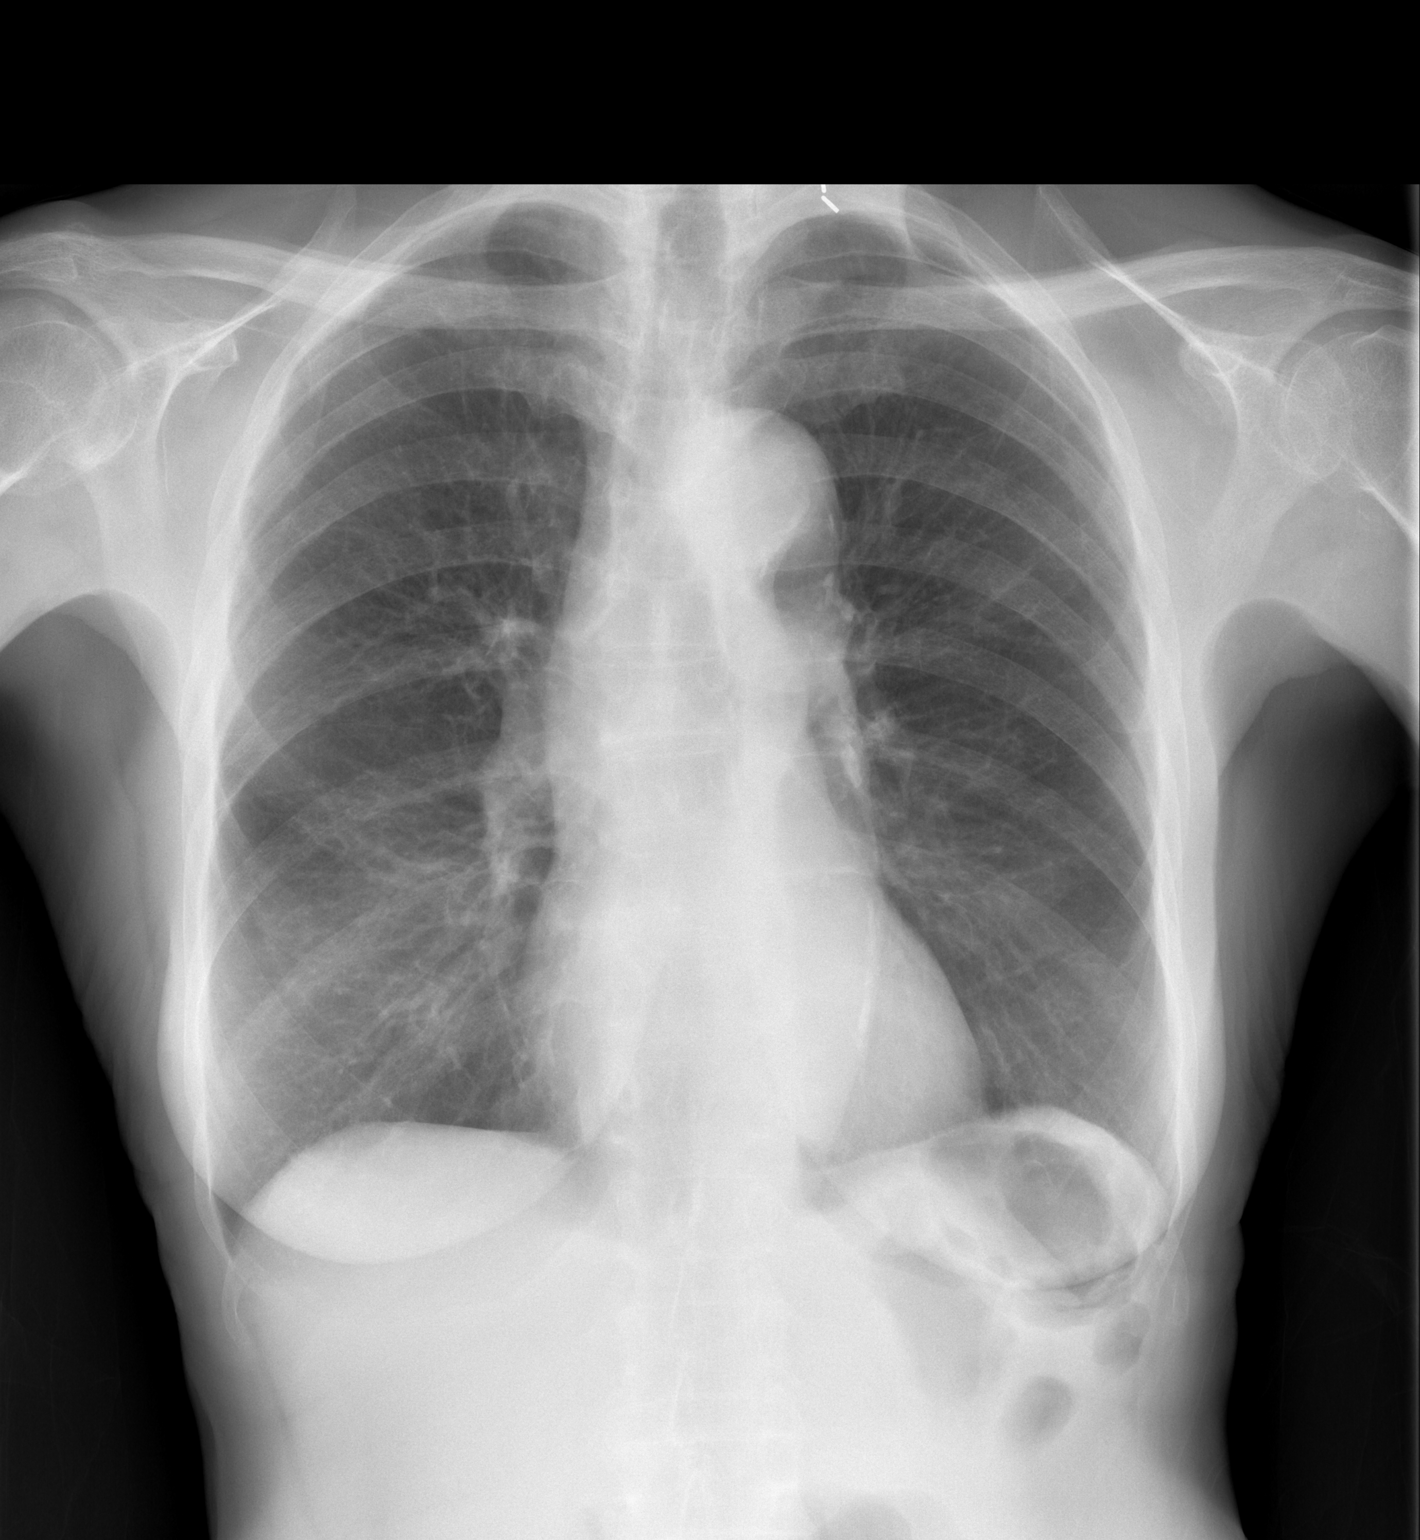

[w chest lat]
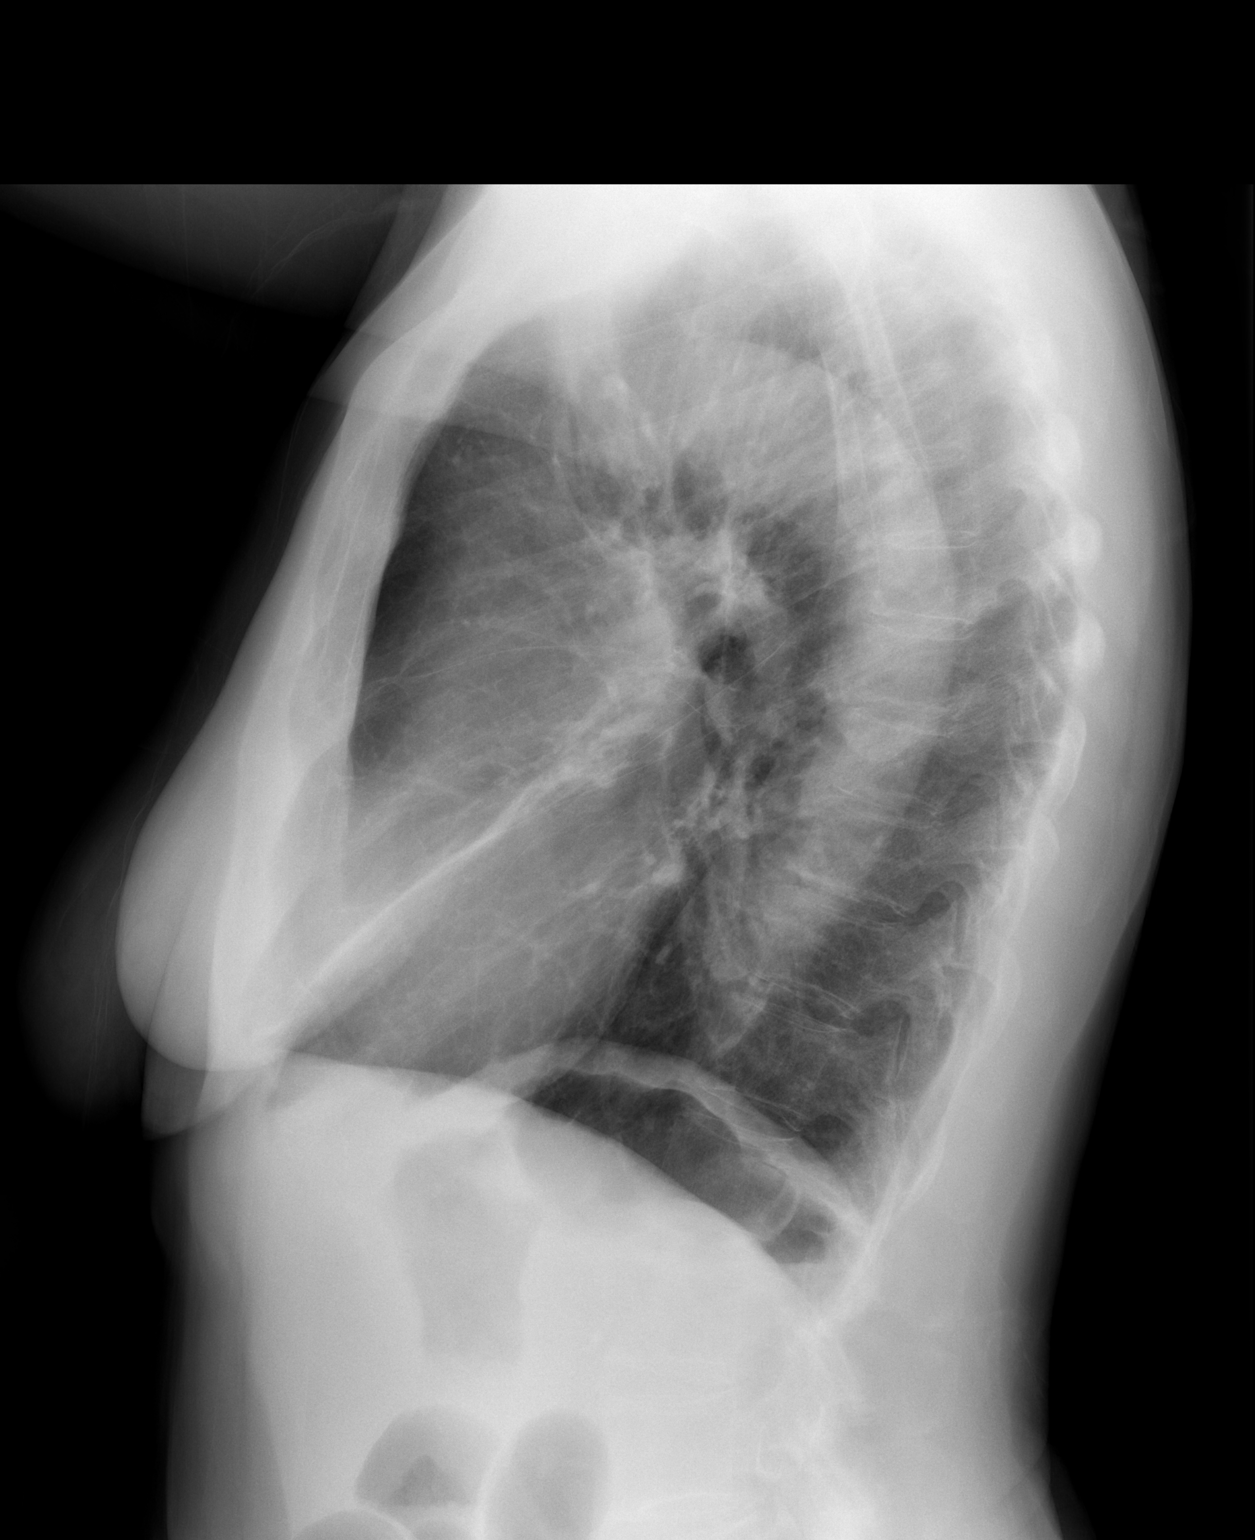

[2 of 2 positions shown; findings below may reference images not displayed]

FINDINGS: Lungs are adequately inflated and otherwise clear. Cardiomediastinal
silhouette is within normal. Calcified plaque over the thoracic
aorta. Remainder of the exam is unchanged.
IMPRESSION: No active cardiopulmonary disease.
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
## 2019-10-16 ENCOUNTER — Telehealth: Payer: Self-pay | Admitting: Internal Medicine

## 2019-10-16 DIAGNOSIS — J449 Chronic obstructive pulmonary disease, unspecified: Secondary | ICD-10-CM

## 2019-10-16 NOTE — Telephone Encounter (Signed)
Pt calling back about medications she is going into the hospital this week and needs this medication could a nurse call her please

## 2019-10-16 NOTE — Telephone Encounter (Signed)
Good afternoon. Based on the prior notes in the chart on this patient, do you have any paperwork related to Symbicort refill/assistance that needs to be sent to [employer]?

## 2019-10-17 ENCOUNTER — Telehealth: Payer: Self-pay | Admitting: Internal Medicine

## 2019-10-17 MED ORDER — BUDESONIDE-FORMOTEROL FUMARATE 160-4.5 MCG/ACT IN AERO: INHALATION_SPRAY | RESPIRATORY_TRACT | 1 refills | Status: AC

## 2019-10-17 NOTE — Telephone Encounter (Signed)
I do not have any paper work on this pt  I have printed her rx for symbicort and faxed to [place]  Pt aware  Nothing further needed

## 2019-10-17 NOTE — Telephone Encounter (Signed)
LMTCB

## 2019-10-19 NOTE — Telephone Encounter (Signed)
Called and spoke to pt. Informed pt that the Symbicort rx was faxed to [place] on 12/1 (per telephone note in chart). Pt states she only has about 4 days left of her Symbicort and we do not have the samples to give her to hold her over till she gets more from [place].   Dr. Melvyn Novas is there another inhaler we can supply her samples of to last her till she receives her Symbicort from [place]?

## 2019-10-20 MED ORDER — BREZTRI AEROSPHERE 160-9-4.8 MCG/ACT IN AERO: 2.0000 | INHALATION_SPRAY | Freq: Two times a day (BID) | RESPIRATORY_TRACT | 0 refills | Status: AC

## 2019-10-20 NOTE — Telephone Encounter (Signed)
breztri Take 2 puffs first thing in am and then another 2 puffs about 12 hours later.    (has symbicort in it plus an extra drug which is only a problem if has glaucoma)

## 2019-10-20 NOTE — Telephone Encounter (Signed)
Called and spoke with pt letting her know that we did not have any samples of symbicort but could provide her with samples of breztri which has symbicort in it. Pt verbalized understanding. Samples placed up front for pt for breztri. Nothing further needed.

## 2020-01-15 ENCOUNTER — Telehealth: Payer: Self-pay | Admitting: Internal Medicine

## 2020-01-15 NOTE — Telephone Encounter (Signed)
Happy to help, but she needs appt [facility] [address]

## 2020-01-16 NOTE — Telephone Encounter (Signed)
LMTCB x2  

## 2020-01-17 NOTE — Telephone Encounter (Signed)
LMTCB x3 for pt. We have attempted to contact pt several times with no success or call back from pt. Per triage protocol, message will be closed.   

## 2020-06-23 ENCOUNTER — Emergency Department (HOSPITAL_BASED_OUTPATIENT_CLINIC_OR_DEPARTMENT_OTHER): Payer: Medicare HMO

## 2020-06-23 ENCOUNTER — Encounter (HOSPITAL_BASED_OUTPATIENT_CLINIC_OR_DEPARTMENT_OTHER): Payer: Self-pay | Admitting: Emergency Medicine

## 2020-06-23 ENCOUNTER — Other Ambulatory Visit: Payer: Self-pay

## 2020-06-23 ENCOUNTER — Emergency Department (HOSPITAL_BASED_OUTPATIENT_CLINIC_OR_DEPARTMENT_OTHER)
Admission: EM | Admit: 2020-06-23 | Discharge: 2020-06-23 | Disposition: A | Discharge status: Home / Self Care | Payer: Medicare HMO | Attending: Emergency Medicine | Admitting: Emergency Medicine

## 2020-06-23 DIAGNOSIS — E876 Hypokalemia: Secondary | ICD-10-CM

## 2020-06-23 DIAGNOSIS — R42 Dizziness and giddiness: Secondary | ICD-10-CM

## 2020-06-23 DIAGNOSIS — F17210 Nicotine dependence, cigarettes, uncomplicated: Secondary | ICD-10-CM | POA: Insufficient documentation

## 2020-06-23 DIAGNOSIS — R531 Weakness: Secondary | ICD-10-CM | POA: Insufficient documentation

## 2020-06-23 DIAGNOSIS — I10 Essential (primary) hypertension: Secondary | ICD-10-CM | POA: Insufficient documentation

## 2020-06-23 DIAGNOSIS — Z79899 Other long term (current) drug therapy: Secondary | ICD-10-CM | POA: Diagnosis not present

## 2020-06-23 DIAGNOSIS — Z7951 Long term (current) use of inhaled steroids: Secondary | ICD-10-CM | POA: Diagnosis not present

## 2020-06-23 DIAGNOSIS — R61 Generalized hyperhidrosis: Secondary | ICD-10-CM | POA: Diagnosis not present

## 2020-06-23 DIAGNOSIS — J449 Chronic obstructive pulmonary disease, unspecified: Secondary | ICD-10-CM | POA: Insufficient documentation

## 2020-06-23 DIAGNOSIS — R05 Cough: Secondary | ICD-10-CM | POA: Insufficient documentation

## 2020-06-23 DIAGNOSIS — R0602 Shortness of breath: Secondary | ICD-10-CM | POA: Diagnosis not present

## 2020-06-23 LAB — URINALYSIS, ROUTINE W REFLEX MICROSCOPIC
Bilirubin Urine: NEGATIVE
Glucose, UA: NEGATIVE mg/dL
Hgb urine dipstick: NEGATIVE
Ketones, ur: NEGATIVE mg/dL
Leukocytes,Ua: NEGATIVE
Nitrite: NEGATIVE
Protein, ur: NEGATIVE mg/dL
Specific Gravity, Urine: 1.01 (ref 1.005–1.030)
pH: 6.5 (ref 5.0–8.0)

## 2020-06-23 LAB — CBC WITH DIFFERENTIAL/PLATELET
Abs Immature Granulocytes: 0.03 10*3/uL (ref 0.00–0.07)
Basophils Absolute: 0 10*3/uL (ref 0.0–0.1)
Basophils Relative: 1 %
Eosinophils Absolute: 0.2 10*3/uL (ref 0.0–0.5)
Eosinophils Relative: 3 %
HCT: 35.6 % — ABNORMAL LOW (ref 36.0–46.0)
Hemoglobin: 11.7 g/dL — ABNORMAL LOW (ref 12.0–15.0)
Immature Granulocytes: 1 %
Lymphocytes Relative: 38 %
Lymphs Abs: 2.5 10*3/uL (ref 0.7–4.0)
MCH: 32.5 pg (ref 26.0–34.0)
MCHC: 32.9 g/dL (ref 30.0–36.0)
MCV: 98.9 fL (ref 80.0–100.0)
Monocytes Absolute: 0.5 10*3/uL (ref 0.1–1.0)
Monocytes Relative: 7 %
Neutro Abs: 3.3 10*3/uL (ref 1.7–7.7)
Neutrophils Relative %: 50 %
Platelets: 327 10*3/uL (ref 150–400)
RBC: 3.6 MIL/uL — ABNORMAL LOW (ref 3.87–5.11)
RDW: 13.5 % (ref 11.5–15.5)
WBC: 6.5 10*3/uL (ref 4.0–10.5)
nRBC: 0 % (ref 0.0–0.2)

## 2020-06-23 LAB — BASIC METABOLIC PANEL
Anion gap: 11 (ref 5–15)
BUN: 14 mg/dL (ref 8–23)
CO2: 27 mmol/L (ref 22–32)
Calcium: 9.4 mg/dL (ref 8.9–10.3)
Chloride: 99 mmol/L (ref 98–111)
Creatinine, Ser: 0.92 mg/dL (ref 0.44–1.00)
GFR calc Af Amer: >60 mL/min (ref >60)
GFR calc non Af Amer: 58 mL/min — ABNORMAL LOW (ref >60)
Glucose, Bld: 87 mg/dL (ref 70–99)
Potassium: 3.4 mmol/L — ABNORMAL LOW (ref 3.5–5.1)
Sodium: 137 mmol/L (ref 135–145)

## 2020-06-23 LAB — TROPONIN I (HIGH SENSITIVITY): Troponin I (High Sensitivity): 6 ng/L (ref <18)

## 2020-06-23 MED ORDER — POTASSIUM CHLORIDE CRYS ER 20 MEQ PO TBCR
20.0000 meq | EXTENDED_RELEASE_TABLET | Freq: Once | ORAL | Status: AC
  Administered 2020-06-23: 20 meq via ORAL
  Filled 2020-06-23: qty 1

## 2020-06-23 NOTE — ED Notes (Signed)
Pt discharged to home. Discharge instructions have been discussed with patient and/or family members. Pt verbally acknowledges understanding d/c instructions, and endorses comprehension to checkout at registration before leaving.  °

## 2020-06-23 NOTE — Discharge Instructions (Signed)
You were seen in the emergency department for evaluation of an episode of excessive sweating yesterday.  You had blood work EKG chest x-ray that did not show any serious findings.  Your potassium was mildly low and you were given an oral supplement for that.  Please contact your primary care doctor for close follow-up.  Return to the emergency department if any worsening or concerning symptoms.

## 2020-06-23 NOTE — ED Triage Notes (Signed)
Pt reports epidode of dizziness and diaphoresis yesterday afternoon while at rest. Pt reports sinus pressure today, and "not feeling right". Pt denies CP, also reports mild sob, endorses COPD

## 2020-06-23 NOTE — ED Provider Notes (Signed)
[facility] EMERGENCY DEPARTMENT Provider Note   CSN: [id number] Arrival date & time: 06/23/20  1236     History Chief Complaint  Patient presents with  . Dizziness    Rita Shepherd is a 85 y.o. female.  She said she had an episode of diaphoresis yesterday that lasted an hour or 2.  She wants to believe it may be related to some bad food.  No nausea vomiting.  Has loose stool as a chronic issue.  Today she feels a little funny in her head.  She is not sure if she is just anxious.  She has had a cough that she calls her bronchitis.  No fevers chills.  Had her Covid vaccine.  The history is provided by the patient.  Weakness Severity:  Moderate Onset quality:  Sudden Progression:  Resolved Chronicity:  New Relieved by:  Rest Worsened by:  Nothing Ineffective treatments:  None tried Associated symptoms: cough and shortness of breath   Associated symptoms: no abdominal pain, no aphasia, no chest pain, no dysphagia, no dysuria, no falls, no fever, no foul-smelling urine, no headaches, no loss of consciousness, no melena, no near-syncope, no stroke symptoms and no vomiting        Past Medical History:  Diagnosis Date  . Bronchitis   . COPD (chronic obstructive pulmonary disease) (HCC)   . Hypertension     Patient Active Problem List   Diagnosis Date Noted  . Chronic sinusitis of both maxillary sinuses 07/25/2018  . Upper airway cough syndrome 07/04/2018  . HBP (high blood pressure) 06/17/2013  . COPD GOLD 1  05/08/2013  . Cigarette smoker 05/08/2013    Past Surgical History:  Procedure Laterality Date  . VESICOVAGINAL FISTULA CLOSURE W/ TAH       OB History   No obstetric history on file.     Family History  Problem Relation Age of Onset  . Prostate cancer Father   . Prostate cancer Brother     Social History   Tobacco Use  . Smoking status: Current Some Day Smoker    Years: 50.00    Types: Cigarettes  . Smokeless tobacco: Never Used  .  Tobacco comment: smokes "a couple of puffs every day or so" and using nicorette gum  Substance Use Topics  . Alcohol use: No  . Drug use: No    Home Medications Prior to Admission medications   Medication Sig Start Date End Date Taking? Authorizing Provider  albuterol (PROAIR HFA) 108 (90 BASE) MCG/ACT inhaler Inhale 2 puffs into the lungs every 6 (six) hours as needed for wheezing or shortness of breath.    [provider]  amLODipine (NORVASC) 5 MG tablet Take 5 mg by mouth daily.    [provider]  Besifloxacin HCl 0.6 % SUSP 1 drop.    [provider]  Budeson-Glycopyrrol-Formoterol (BREZTRI AEROSPHERE) 160-9-4.8 MCG/ACT AERO Inhale 2 puffs into the lungs 2 (two) times daily. 10/20/19   [name], Shadow Schedler B, MD  budesonide-formoterol (SYMBICORT) 160-4.5 MCG/ACT inhaler Take 2 puffs first thing in am and then another 2 puffs about 12 hours later. 10/17/19   [name], Ruger Saxer B, MD  Cholecalciferol (VITAMIN D) 2000 UNITS CAPS Take 1 capsule by mouth daily.    [provider]  dextromethorphan-guaiFENesin (MUCINEX DM) 30-600 MG per 12 hr tablet Take 1 tablet by mouth every 12 (twelve) hours as needed.    [provider]  docusate calcium (SURFAK) 240 MG capsule Take by mouth.  [provider]  docusate sodium (COLACE) 100 MG capsule Take 100 mg by mouth at bedtime.    [provider]  fluticasone (FLONASE) 50 MCG/ACT nasal spray Place 2 sprays into both nostrils daily. 07/02/18   Loren Racer, MD  Homeopathic Products ([employer] LEG CRAMPS PAIN RELIEF) TABS Take 1 capsule by mouth daily as needed.    [provider]  Hypertonic Nasal Wash (SINUS RINSE NA) Place into the nose 2 (two) times daily as needed.    [provider]  loratadine (CLARITIN) 10 MG tablet Take 1 tablet (10 mg total) by mouth daily. 07/02/18   Loren Racer, MD  losartan-hydrochlorothiazide (HYZAAR) 100-12.5 MG per tablet Take 1 tablet by mouth daily.     [provider]  magnesium oxide (MAG-OX) 400 MG tablet Take by mouth.    [provider]  omega-3 acid ethyl esters (LOVAZA) 1 g capsule Take by mouth.    [provider]  Omega-3 Fatty Acids (FISH OIL PO) Take 1 capsule by mouth daily.    [provider]  Potassium 99 MG TABS Take by mouth.    [provider]  Probiotic Product (HEALTHY COLON) CAPS Take 1 capsule by mouth daily.    [provider]  traZODone (DESYREL) 50 MG tablet TK 1 T PO HS 04/22/18   [provider]  vitamin E 400 UNIT capsule Take 400 Units by mouth daily.    [provider]    Allergies    Iodine, Contrast media [iodinated diagnostic agents], and Metrizamide  Review of Systems   Review of Systems  Constitutional: Positive for diaphoresis. Negative for fever.  HENT: Negative for sore throat.   Eyes: Negative for pain.  Respiratory: Positive for cough and shortness of breath.   Cardiovascular: Negative for chest pain and near-syncope.  Gastrointestinal: Negative for abdominal pain, dysphagia, melena and vomiting.  Genitourinary: Negative for dysuria.  Musculoskeletal: Negative for back pain and falls.  Skin: Negative for rash.  Neurological: Positive for weakness. Negative for loss of consciousness and headaches.    Physical Exam Updated Vital Signs BP (!) 170/95 (BP Location: Left Arm)   Pulse 67   Temp 98.9 F (37.2 C) (Oral)   Resp 18   Ht 5\' 7"  (1.702 m)   Wt 56.7 kg   SpO2 100%   BMI 19.58 kg/m   Physical Exam Vitals and nursing note reviewed.  Constitutional:      General: She is not in acute distress.    Appearance: Normal appearance. She is well-developed.  HENT:     Head: Normocephalic and atraumatic.  Eyes:     Conjunctiva/sclera: Conjunctivae normal.  Cardiovascular:     Rate and Rhythm: Normal rate and regular rhythm.     Heart sounds: No murmur heard.   Pulmonary:     Effort: Pulmonary effort is normal. No  respiratory distress.     Breath sounds: Normal breath sounds.  Abdominal:     Palpations: Abdomen is soft.     Tenderness: There is no abdominal tenderness.  Musculoskeletal:        General: No deformity or signs of injury. Normal range of motion.     Cervical back: Neck supple.  Skin:    General: Skin is warm and dry.     Capillary Refill: Capillary refill takes less than 2 seconds.  Neurological:     General: No focal deficit present.     Mental Status: She is alert.     ED  Results / Procedures / Treatments   Labs (all labs ordered are listed, but only abnormal results are displayed) Labs Reviewed  BASIC METABOLIC PANEL - Abnormal; Notable for the following components:      Result Value   Potassium 3.4 (*)    GFR calc non Af Amer 58 (*)    All other components within normal limits  CBC WITH DIFFERENTIAL/PLATELET - Abnormal; Notable for the following components:   RBC 3.60 (*)    Hemoglobin 11.7 (*)    HCT 35.6 (*)    All other components within normal limits  URINALYSIS, ROUTINE W REFLEX MICROSCOPIC  TROPONIN I (HIGH SENSITIVITY)    EKG EKG Interpretation  Date/Time:  Sunday June 23 2020 13:02:32 EDT Ventricular Rate:  63 PR Interval:  166 QRS Duration: 72 QT Interval:  394 QTC Calculation: 403 R Axis:   -35 Text Interpretation: Normal sinus rhythm Possible Left atrial enlargement Left axis deviation Anterior infarct , age undetermined Abnormal ECG No significant change since prior 8/19 Confirmed by Meridee Score [phone]) on 06/23/2020 1:29:56 PM   Radiology DG Chest Port 1 View  Result Date: 06/23/2020 CLINICAL DATA:  Weakness, COPD EXAM: PORTABLE CHEST 1 VIEW COMPARISON:  07/02/2018 chest radiograph. FINDINGS: Stable cardiomediastinal silhouette with normal heart size. No pneumothorax. No pleural effusion. Mildly hyperinflated lungs. No pulmonary edema. No acute consolidative airspace disease. Surgical clips again seen in the lower left neck. IMPRESSION: Mildly  hyperinflated lungs, suggesting COPD. No acute cardiopulmonary disease. Electronically Signed   By: Delbert Phenix M.D.   On: 06/23/2020 14:52    Procedures Procedures (including critical care time)  Medications Ordered in ED Medications  potassium chloride SA (KLOR-CON) CR tablet 20 mEq (20 mEq Oral Given 06/23/20 1514)    ED Course  I have reviewed the triage vital signs and the nursing notes.  Pertinent labs & imaging results that were available during my care of the patient were reviewed by me and considered in my medical decision making (see chart for details).  Clinical Course as of Jun 23 1722  Wynelle Link Jun 23, 2020  1439 Chest x-ray interpreted by me as no acute infiltrates.   [MB]  1509 Patient ambulated in department well. Gave a urine that looked clear. Having no urinary sx. Will discharge. Think only needs one trop as diaphoresis was yesterday.    [MB]    Clinical Course User Index [MB] Terrilee Files, MD   MDM Rules/Calculators/A&P                         This patient complains of 2-hour episode of diaphoresis yesterday, feeling just a little off today; this involves an extensive number of treatment Options and is a complaint that carries with it a high risk of complications and Morbidity. The differential includes ACS, arrhythmia, anemia, metabolic derangement, infection  I ordered, reviewed and interpreted labs, which included CBC with normal white count, low hemoglobin stable compared to priors, chemistries with a mildly low potassium, normal renal function, normal troponin, urinalysis unremarkable I ordered medication potassium I ordered imaging studies which included chest x-ray and I independently    visualized and interpreted imaging which showed no acute infiltrate Previous records obtained and reviewed in epic, no recent admissions  After the interventions stated above, I reevaluated the patient and found patient to be basically asymptomatic currently.  Ambulated  in the department well.  No serious etiologies identified currently.  Recommended PCP follow-up and return instructions discussed.  Final Clinical Impression(s) / ED Diagnoses Final diagnoses:  Diaphoresis  Lightheadedness  Hypokalemia    Rx / DC Orders ED Discharge Orders    None       Terrilee Files, MD 06/23/20 1725
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
## 2024-08-28 ENCOUNTER — Encounter (HOSPITAL_COMMUNITY): Payer: Self-pay

## 2024-08-28 ENCOUNTER — Emergency Department (HOSPITAL_COMMUNITY)

## 2024-08-28 ENCOUNTER — Emergency Department (HOSPITAL_COMMUNITY)
Admission: EM | Admit: 2024-08-28 | Discharge: 2024-08-28 | Disposition: A | Discharge status: Skilled Nursing Facility | Attending: Emergency Medicine | Admitting: Emergency Medicine

## 2024-08-28 ENCOUNTER — Other Ambulatory Visit: Payer: Self-pay

## 2024-08-28 DIAGNOSIS — I10 Essential (primary) hypertension: Secondary | ICD-10-CM | POA: Diagnosis not present

## 2024-08-28 DIAGNOSIS — W19XXXA Unspecified fall, initial encounter: Secondary | ICD-10-CM | POA: Insufficient documentation

## 2024-08-28 DIAGNOSIS — Z79899 Other long term (current) drug therapy: Secondary | ICD-10-CM | POA: Diagnosis not present

## 2024-08-28 DIAGNOSIS — M25552 Pain in left hip: Secondary | ICD-10-CM | POA: Diagnosis present

## 2024-08-28 DIAGNOSIS — Z7951 Long term (current) use of inhaled steroids: Secondary | ICD-10-CM | POA: Diagnosis not present

## 2024-08-28 DIAGNOSIS — J449 Chronic obstructive pulmonary disease, unspecified: Secondary | ICD-10-CM | POA: Diagnosis not present

## 2024-08-28 DIAGNOSIS — F0390 Unspecified dementia without behavioral disturbance: Secondary | ICD-10-CM | POA: Insufficient documentation

## 2024-08-28 MED ORDER — ACETAMINOPHEN 500 MG PO TABS
1000.0000 mg | ORAL_TABLET | Freq: Once | ORAL | Status: AC
  Administered 2024-08-28: 1000 mg via ORAL
  Filled 2024-08-28: qty 2

## 2024-08-28 NOTE — ED Notes (Signed)
 This RN attempted to call [facility] at [phone] (number on facility paperwork).  No answer.

## 2024-08-28 NOTE — ED Notes (Signed)
 Pt brief changed, peri care performed.  Pt repositioned.

## 2024-08-28 NOTE — ED Triage Notes (Addendum)
 Pt from [facility] via EMS for fall.  Fall was unwitnessed, denies hitting head, denies LOC, no blood thinners.  Pt was able to use call light after fall and was able to stand to get back into bed.  No obvious injuries noted. Pt reports pain in L hip when moving, but 0/10 pain when still.
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
## 2024-08-28 NOTE — ED Notes (Signed)
 [facility] has arrived to transport pt back Rita Shepherd.   All necessary paperwork provided.

## 2024-08-28 NOTE — ED Notes (Signed)
 This RN called [facility] and scheduled a nonemergent transfer back to facility.  No ETA given.

## 2024-08-28 NOTE — ED Notes (Signed)
 This RN spoke with Tillman at [facility].  Discussed pt imaging, current condition, and plan to transport back.  All questions asked were answered.

## 2024-09-20 ENCOUNTER — Encounter (HOSPITAL_COMMUNITY): Payer: Self-pay

## 2024-09-20 ENCOUNTER — Emergency Department (HOSPITAL_COMMUNITY)

## 2024-09-20 ENCOUNTER — Other Ambulatory Visit: Payer: Self-pay

## 2024-09-20 ENCOUNTER — Emergency Department (HOSPITAL_COMMUNITY)
Admission: EM | Admit: 2024-09-20 | Discharge: 2024-09-21 | Disposition: A | Discharge status: Skilled Nursing Facility | Source: Skilled Nursing Facility | Attending: Emergency Medicine | Admitting: Emergency Medicine

## 2024-09-20 DIAGNOSIS — M25552 Pain in left hip: Secondary | ICD-10-CM | POA: Insufficient documentation

## 2024-09-20 DIAGNOSIS — W19XXXA Unspecified fall, initial encounter: Secondary | ICD-10-CM

## 2024-09-20 DIAGNOSIS — W1839XA Other fall on same level, initial encounter: Secondary | ICD-10-CM | POA: Insufficient documentation

## 2024-09-20 NOTE — ED Provider Notes (Incomplete)
 [place] EMERGENCY DEPARTMENT AT [facility] Provider Note   CSN: [id number] Arrival date & time: 09/20/24  2155     History Chief Complaint  Patient presents with  . Fall    HPI Rita Shepherd is a 89 y.o. female presenting for fall from standing at SNF.   Patient's recorded medical, surgical, social, medication list and allergies were reviewed in the Snapshot window as part of the initial history.   Review of Systems   Review of Systems  Constitutional:  Negative for chills and fever.  HENT:  Negative for ear pain and sore throat.   Eyes:  Negative for pain and visual disturbance.  Respiratory:  Negative for cough and shortness of breath.   Cardiovascular:  Negative for chest pain and palpitations.  Gastrointestinal:  Negative for abdominal pain and vomiting.  Genitourinary:  Negative for dysuria and hematuria.  Musculoskeletal:  Positive for gait problem. Negative for arthralgias and back pain.  Skin:  Negative for color change and rash.  Neurological:  Negative for seizures and syncope.  All other systems reviewed and are negative.   Physical Exam Updated Vital Signs BP (!) 168/92 (BP Location: Left Arm)   Pulse 78   Temp 97.9 F (36.6 C) (Oral)   Resp 18   Ht 5' 5 (1.651 m)   Wt 54.4 kg   SpO2 98%   BMI 19.97 kg/m  Physical Exam Constitutional:      General: She is not in acute distress.    Appearance: She is not ill-appearing or toxic-appearing.  HENT:     Head: Normocephalic and atraumatic.  Eyes:     Extraocular Movements: Extraocular movements intact.     Pupils: Pupils are equal, round, and reactive to light.  Cardiovascular:     Rate and Rhythm: Normal rate.  Pulmonary:     Effort: No respiratory distress.  Abdominal:     General: Abdomen is flat.  Musculoskeletal:        General: No swelling, deformity or signs of injury.     Cervical back: Normal range of motion. No rigidity.  Skin:    General: Skin is warm and dry.   Neurological:     General: No focal deficit present.     Mental Status: She is alert and oriented to person, place, and time.  Psychiatric:        Mood and Affect: Mood normal.      ED Course/ Medical Decision Making/ A&P    Procedures Procedures   Medications Ordered in ED Medications - No data to display Medical Decision Making:   Rita Shepherd is a 89 y.o. female who presented to the ED today with a fall at their living facility detailed above. They are not on a blood thinner. Handoff received from EMS.  Patient placed on continuous vitals and telemetry monitoring while in ED which was reviewed periodically.   Complete initial physical exam performed, notably the patient  was hemodynamically stable  in no acute distress. No obvious deformities or injuries appreciated on extensive physical exam including active range of motion of all joints.     Reviewed and confirmed nursing documentation for past medical history, family history, social history.    Initial Assessment/Plan:   This is a patient presenting with a moderate blunt mechanism trauma.  As such, I have considered intracranial injuries including intracranial hemorrhage, intrathoracic injuries including blunt myocardial or blunt lung injury, blunt abdominal injuries including aortic dissection, bladder injury, spleen injury, liver  injury and I have considered orthopedic injuries including extremity or spinal injury. This is most consistent with an acute life/limb threatening illness complicated by underlying chronic conditions.  With the patient's presentation of moderate mechanism trauma but an otherwise reassuring exam, patient warrants targeted evaluation for potential traumatic injuries. Will proceed with targeted evaluation for potential injuries. Will proceed with targettet hip XR.   Images reviewed and agree with radiology interpretation.  DG Hip Unilat W or Wo Pelvis 2-3 Views Left Result Date: 09/20/2024 EXAM:  2 or 3 VIEW(S) XRAY OF THE LEFT HIP 09/20/2024 11:28:00 PM COMPARISON: None available. CLINICAL HISTORY: fall fall FINDINGS: BONES AND JOINTS: The bones are diffusely osteopenic. There are moderate degenerative changes of the left hip with joint space narrowing, sclerosis and osteophyte formation. Bilateral with screws appear uncomplicated. SOFT TISSUES: Surgical clips in the bilateral left inguinal regions. Peripheral vascular calcifications are present. There is no evidence for hydronephrosis. IMPRESSION: 1. No acute findings. Electronically signed by: Greig Pique MD 09/20/2024 11:36 PM EST RP Workstation: [id number]   Final Reassessment and Plan:   ***   ***    Clinical Impression: No diagnosis found.   Data Unavailable   Final Clinical Impression(s) / ED Diagnoses Final diagnoses:  None    Rx / DC Orders ED Discharge Orders     None

## 2024-09-20 NOTE — ED Provider Notes (Signed)
 [place] EMERGENCY DEPARTMENT AT [facility] Provider Note   CSN: [id number] Arrival date & time: 09/20/24  2155     History Chief Complaint  Patient presents with   Fall    HPI Rita Shepherd is a 89 y.o. female presenting for fall from standing at SNF. Endorsing left hip pain.  Patient's recorded medical, surgical, social, medication list and allergies were reviewed in the Snapshot window as part of the initial history.   Review of Systems   Review of Systems  Constitutional:  Negative for chills and fever.  HENT:  Negative for ear pain and sore throat.   Eyes:  Negative for pain and visual disturbance.  Respiratory:  Negative for cough and shortness of breath.   Cardiovascular:  Negative for chest pain and palpitations.  Gastrointestinal:  Negative for abdominal pain and vomiting.  Genitourinary:  Negative for dysuria and hematuria.  Musculoskeletal:  Positive for gait problem. Negative for arthralgias and back pain.  Skin:  Negative for color change and rash.  Neurological:  Negative for seizures and syncope.  All other systems reviewed and are negative.   Physical Exam Updated Vital Signs BP (!) 168/92 (BP Location: Left Arm)   Pulse 78   Temp 97.9 F (36.6 C) (Oral)   Resp 18   Ht 5' 5 (1.651 m)   Wt 54.4 kg   SpO2 98%   BMI 19.97 kg/m  Physical Exam Constitutional:      General: She is not in acute distress.    Appearance: She is not ill-appearing or toxic-appearing.  HENT:     Head: Normocephalic and atraumatic.  Eyes:     Extraocular Movements: Extraocular movements intact.     Pupils: Pupils are equal, round, and reactive to light.  Cardiovascular:     Rate and Rhythm: Normal rate.  Pulmonary:     Effort: No respiratory distress.  Abdominal:     General: Abdomen is flat.  Musculoskeletal:        General: No swelling, deformity or signs of injury.     Cervical back: Normal range of motion. No rigidity.  Skin:    General: Skin  is warm and dry.  Neurological:     General: No focal deficit present.     Mental Status: She is alert and oriented to person, place, and time.  Psychiatric:        Mood and Affect: Mood normal.      ED Course/ Medical Decision Making/ A&P    Procedures Procedures   Medications Ordered in ED Medications - No data to display Medical Decision Making:   Rita Shepherd is a 89 y.o. female who presented to the ED today with a fall at their living facility detailed above. They are not on a blood thinner. Handoff received from EMS.  Patient placed on continuous vitals and telemetry monitoring while in ED which was reviewed periodically.   Complete initial physical exam performed, notably the patient  was hemodynamically stable  in no acute distress. No obvious deformities or injuries appreciated on extensive physical exam including active range of motion of all joints.     Reviewed and confirmed nursing documentation for past medical history, family history, social history.    Initial Assessment/Plan:   This is a patient presenting with a moderate blunt mechanism trauma.  As such, I have considered intracranial injuries including intracranial hemorrhage, intrathoracic injuries including blunt myocardial or blunt lung injury, blunt abdominal injuries including aortic dissection, bladder injury,  spleen injury, liver injury and I have considered orthopedic injuries including extremity or spinal injury. This is most consistent with an acute life/limb threatening illness complicated by underlying chronic conditions.  With the patient's presentation of moderate mechanism trauma but an otherwise reassuring exam, patient warrants targeted evaluation for potential traumatic injuries. Will proceed with targeted evaluation for potential injuries. Will proceed with targettet hip XR.   Images reviewed and agree with radiology interpretation.  DG Hip Unilat W or Wo Pelvis 2-3 Views Left Result Date:  09/20/2024 EXAM: 2 or 3 VIEW(S) XRAY OF THE LEFT HIP 09/20/2024 11:28:00 PM COMPARISON: None available. CLINICAL HISTORY: fall fall FINDINGS: BONES AND JOINTS: The bones are diffusely osteopenic. There are moderate degenerative changes of the left hip with joint space narrowing, sclerosis and osteophyte formation. Bilateral with screws appear uncomplicated. SOFT TISSUES: Surgical clips in the bilateral left inguinal regions. Peripheral vascular calcifications are present. There is no evidence for hydronephrosis. IMPRESSION: 1. No acute findings. Electronically signed by: Greig Pique MD 09/20/2024 11:36 PM EST RP Workstation: [id number]   Final Reassessment and Plan:   Pain resolved by time of my reassessment. Patient feels ok returning to facility.   Disposition:  I have considered need for hospitalization, however, considering all of the above, I believe this patient is stable for discharge at this time.  Patient/family educated about specific return precautions for given chief complaint and symptoms.  Patient/family educated about follow-up with PCP.     Patient/family expressed understanding of return precautions and need for follow-up. Patient spoken to regarding all imaging and laboratory results and appropriate follow up for these results. All education provided in verbal form with additional information in written form. Time was allowed for answering of patient questions. Patient discharged.    Emergency Department Medication Summary:   Medications - No data to display         Clinical Impression:  1. Fall, initial encounter      Discharge   Final Clinical Impression(s) / ED Diagnoses Final diagnoses:  Fall, initial encounter    Rx / DC Orders ED Discharge Orders     None         Jerral Meth, MD 09/21/24 0110

## 2024-09-20 NOTE — ED Triage Notes (Signed)
 Pt BIB [facility] from [facility] d/t fall. Pt was in bed and tried to pivot to [facility] and lost footing - mechanical fall.  Pt has no complaints but brother said she need to come to ER.  Left hip pain is normal 4/10 d/t past surgery.    BP 152/84 HR 76 97% RA 18 RR

## 2024-10-05 ENCOUNTER — Other Ambulatory Visit: Payer: Self-pay

## 2024-10-05 ENCOUNTER — Encounter (HOSPITAL_COMMUNITY): Payer: Self-pay | Admitting: Emergency Medicine

## 2024-10-05 ENCOUNTER — Emergency Department (HOSPITAL_COMMUNITY)
Admission: EM | Admit: 2024-10-05 | Discharge: 2024-10-05 | Disposition: A | Discharge status: Home / Self Care | Attending: Emergency Medicine | Admitting: Emergency Medicine

## 2024-10-05 ENCOUNTER — Emergency Department (HOSPITAL_COMMUNITY)

## 2024-10-05 DIAGNOSIS — Z79899 Other long term (current) drug therapy: Secondary | ICD-10-CM | POA: Insufficient documentation

## 2024-10-05 DIAGNOSIS — I10 Essential (primary) hypertension: Secondary | ICD-10-CM | POA: Diagnosis not present

## 2024-10-05 DIAGNOSIS — F0390 Unspecified dementia without behavioral disturbance: Secondary | ICD-10-CM | POA: Insufficient documentation

## 2024-10-05 DIAGNOSIS — R030 Elevated blood-pressure reading, without diagnosis of hypertension: Secondary | ICD-10-CM | POA: Diagnosis present

## 2024-10-05 LAB — BASIC METABOLIC PANEL WITH GFR
Anion gap: 13 (ref 5–15)
BUN: 18 mg/dL (ref 8–23)
CO2: 18 mmol/L — ABNORMAL LOW (ref 22–32)
Calcium: 9.6 mg/dL (ref 8.9–10.3)
Chloride: 103 mmol/L (ref 98–111)
Creatinine, Ser: 0.89 mg/dL (ref 0.44–1.00)
GFR, Estimated: >60 mL/min (ref >60)
Glucose, Bld: 81 mg/dL (ref 70–99)
Potassium: 4.9 mmol/L (ref 3.5–5.1)
Sodium: 134 mmol/L — ABNORMAL LOW (ref 135–145)

## 2024-10-05 LAB — CBC
HCT: 38.6 % (ref 36.0–46.0)
Hemoglobin: 11.6 g/dL — ABNORMAL LOW (ref 12.0–15.0)
MCH: 33.1 pg (ref 26.0–34.0)
MCHC: 30.1 g/dL (ref 30.0–36.0)
MCV: 110.3 fL — ABNORMAL HIGH (ref 80.0–100.0)
Platelets: 230 K/uL (ref 150–400)
RBC: 3.5 MIL/uL — ABNORMAL LOW (ref 3.87–5.11)
RDW: 13.2 % (ref 11.5–15.5)
WBC: 6.3 K/uL (ref 4.0–10.5)
nRBC: 0 % (ref 0.0–0.2)

## 2024-10-05 LAB — TROPONIN T, HIGH SENSITIVITY: Troponin T High Sensitivity: 19 ng/L (ref 0–19)

## 2024-10-05 MED ORDER — CLONIDINE HCL 0.1 MG PO TABS
0.2000 mg | ORAL_TABLET | Freq: Once | ORAL | Status: AC
  Administered 2024-10-05: 0.2 mg via ORAL
  Filled 2024-10-05: qty 2

## 2024-10-05 MED ORDER — CLONIDINE HCL 0.2 MG PO TABS: 0.2000 mg | ORAL_TABLET | Freq: Two times a day (BID) | ORAL | 0 refills | Status: AC | PRN
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
## 2024-10-05 NOTE — ED Triage Notes (Signed)
 Patient BIB EMS from [facility] c/o hypertension (200/110). Per report patient was compliant with her medication. Patient denies Chest pain and SOB.

## 2024-10-05 NOTE — Discharge Instructions (Signed)
 Rita Shepherd's blood testing and CT scan of the brain were normal today.  She was noted to have high blood pressure.  She was given clonidine  which I prescribed is an as needed rescue medication.  Please have her follow-up with a facility provider.

## 2024-10-05 NOTE — ED Provider Triage Note (Signed)
 Emergency Medicine Provider Triage Evaluation Note  Rita Shepherd , a 89 y.o. female  was evaluated in triage.  Patient was brought from her memory care facility with concern for elevated blood pressure.  She has no complaints.  Denies headache or blurred vision.  EMS reports blood pressure 200/110 mmhg.  Review of Systems   Negative: Headache, blurred vision  Physical Exam  BP (!) 200/110 (BP Location: Right Arm)   Pulse 74   Temp 98.2 F (36.8 C) (Oral)   Resp 16   SpO2 98%  Gen:   Awake, no distress   Resp:  Normal effort  MSK:   Moves extremities without difficulty    Medical Decision Making  Medically screening exam initiated at 5:01 PM.  Appropriate orders placed.  Rita Shepherd was informed that the remainder of the evaluation will be completed by another provider, this initial triage assessment does not replace that evaluation, and the importance of remaining in the ED until their evaluation is complete.  Asymptomatic hypertension.  However she does have dementia and cannot provide accurate history nor reliable complete neuro exam.  Oriented to self and place only.  No evident LVO symptoms or neurological deficits, but will evaluate Cr, Trop CT head and give clonidine    Rita Donnice PARAS, MD 10/05/24 [id number]
